# Patient Record
Sex: Female | Born: 1975 | Race: Black or African American | Hispanic: No | Marital: Single | Smoking: Current every day smoker
Health system: Southern US, Community
[De-identification: ages and names within clinical notes are randomized; demographics above are authoritative.]

## PROBLEM LIST (undated history)

## (undated) DIAGNOSIS — R519 Headache, unspecified: Secondary | ICD-10-CM

## (undated) DIAGNOSIS — F419 Anxiety disorder, unspecified: Secondary | ICD-10-CM

## (undated) DIAGNOSIS — R51 Headache: Secondary | ICD-10-CM

## (undated) DIAGNOSIS — B019 Varicella without complication: Secondary | ICD-10-CM

## (undated) DIAGNOSIS — E785 Hyperlipidemia, unspecified: Secondary | ICD-10-CM

## (undated) DIAGNOSIS — F32A Depression, unspecified: Secondary | ICD-10-CM

## (undated) DIAGNOSIS — K219 Gastro-esophageal reflux disease without esophagitis: Secondary | ICD-10-CM

## (undated) HISTORY — DX: Headache: R51

## (undated) HISTORY — DX: Varicella without complication: B01.9

## (undated) HISTORY — DX: Anxiety disorder, unspecified: F41.9

## (undated) HISTORY — DX: Hyperlipidemia, unspecified: E78.5

## (undated) HISTORY — DX: Gastro-esophageal reflux disease without esophagitis: K21.9

## (undated) HISTORY — DX: Headache, unspecified: R51.9

## (undated) HISTORY — DX: Depression, unspecified: F32.A

## (undated) HISTORY — PX: APPENDECTOMY: SHX54

## (undated) HISTORY — PX: TONSILLECTOMY: SUR1361

---

## 2003-12-12 DIAGNOSIS — O039 Complete or unspecified spontaneous abortion without complication: Secondary | ICD-10-CM

## 2003-12-12 HISTORY — DX: Complete or unspecified spontaneous abortion without complication: O03.9

## 2004-01-26 ENCOUNTER — Inpatient Hospital Stay (HOSPITAL_COMMUNITY): Admission: AD | Admit: 2004-01-26 | Discharge: 2004-01-26 | Payer: Self-pay | Admitting: *Deleted

## 2004-02-05 ENCOUNTER — Inpatient Hospital Stay (HOSPITAL_COMMUNITY): Admission: RE | Admit: 2004-02-05 | Discharge: 2004-02-05 | Payer: Self-pay | Admitting: Family Medicine

## 2004-02-12 ENCOUNTER — Encounter: Payer: Self-pay | Admitting: *Deleted

## 2004-02-12 ENCOUNTER — Encounter (INDEPENDENT_AMBULATORY_CARE_PROVIDER_SITE_OTHER): Payer: Self-pay | Admitting: Specialist

## 2004-02-12 ENCOUNTER — Ambulatory Visit (HOSPITAL_COMMUNITY): Admission: RE | Admit: 2004-02-12 | Discharge: 2004-02-12 | Payer: Self-pay | Admitting: Obstetrics and Gynecology

## 2004-03-03 ENCOUNTER — Encounter: Admission: RE | Admit: 2004-03-03 | Discharge: 2004-03-03 | Payer: Self-pay | Admitting: Obstetrics and Gynecology

## 2004-04-28 ENCOUNTER — Emergency Department (HOSPITAL_COMMUNITY): Admission: EM | Admit: 2004-04-28 | Discharge: 2004-04-28 | Payer: Self-pay | Admitting: Emergency Medicine

## 2004-12-28 ENCOUNTER — Other Ambulatory Visit: Admission: RE | Admit: 2004-12-28 | Discharge: 2004-12-28 | Payer: Self-pay | Admitting: Obstetrics and Gynecology

## 2005-04-10 ENCOUNTER — Inpatient Hospital Stay (HOSPITAL_COMMUNITY): Admission: AD | Admit: 2005-04-10 | Discharge: 2005-04-10 | Payer: Self-pay | Admitting: Obstetrics and Gynecology

## 2005-07-26 ENCOUNTER — Inpatient Hospital Stay (HOSPITAL_COMMUNITY): Admission: AD | Admit: 2005-07-26 | Discharge: 2005-07-28 | Payer: Self-pay | Admitting: Obstetrics and Gynecology

## 2006-03-20 ENCOUNTER — Emergency Department (HOSPITAL_COMMUNITY): Admission: EM | Admit: 2006-03-20 | Discharge: 2006-03-20 | Payer: Self-pay | Admitting: Emergency Medicine

## 2006-06-04 ENCOUNTER — Other Ambulatory Visit: Admission: RE | Admit: 2006-06-04 | Discharge: 2006-06-04 | Payer: Self-pay | Admitting: Obstetrics and Gynecology

## 2006-10-27 ENCOUNTER — Emergency Department (HOSPITAL_COMMUNITY): Admission: EM | Admit: 2006-10-27 | Discharge: 2006-10-27 | Payer: Self-pay | Admitting: Emergency Medicine

## 2007-08-25 ENCOUNTER — Emergency Department (HOSPITAL_COMMUNITY): Admission: EM | Admit: 2007-08-25 | Discharge: 2007-08-26 | Payer: Self-pay | Admitting: Emergency Medicine

## 2007-08-30 ENCOUNTER — Emergency Department (HOSPITAL_COMMUNITY): Admission: EM | Admit: 2007-08-30 | Discharge: 2007-08-30 | Payer: Self-pay | Admitting: Emergency Medicine

## 2007-09-07 ENCOUNTER — Inpatient Hospital Stay (HOSPITAL_COMMUNITY): Admission: AD | Admit: 2007-09-07 | Discharge: 2007-09-09 | Payer: Self-pay | Admitting: Obstetrics and Gynecology

## 2011-04-25 NOTE — H&P (Signed)
[name], [name]                ACCOUNT NO.:  [id number]   MEDICAL RECORD NO.:  [id number]          PATIENT TYPE:  INP   LOCATION:  9168                          [facility]ILITY:  [facility]   PHYSICIAN:  Janine Limbo, M.D.DATE OF BIRTH:  [birth year]   DATE OF ADMISSION:  09/07/2007  DATE OF DISCHARGE:                              HISTORY & PHYSICAL   HISTORY OF PRESENT ILLNESS:  This is a 35 year old, African-American  female, gravida 3, para 1-0-1-1, who is admitted at 11 and 1/7th weeks  for induction of labor secondary to post dates.  The patient reports  that she has had occasional mild uterine contractions; however, she  denies rupture of membranes or bleeding.  The patient reports that her  fetus has been moving normally.  The patient denies any signs or  symptoms associated with preeclampsia.  The patient's pregnancy has been  followed by the CNM Service at [facility].  The patient's  pregnancy is remarkable for:  1)  First trimester UTI; 2)  Tobacco use.   ALLERGIES:  No known allergies, no known drug allergies.   OBSTETRIC HISTORY:  1. Pregnancy #1 in 2005:  First trimester spontaneous abortion with      D&C without complications.  2. Pregnancy #2 in 2006:  Spontaneous vaginal delivery, female infant, 8      pounds 0 ounces, at [redacted] weeks gestation, eight hour labor, with no      complications.  3. Pregnancy #3 is current.   PAST MEDICAL HISTORY:  Negative.   PAST SURGICAL HISTORY:  1. In 1989, appendectomy.  2. In 2000, tonsillectomy.  3. In 2005, D&C.   GYNECOLOGIC HISTORY:  The patient denies any history of abnormal Pap  smears.  The patient denies any history of STDs.  The patient reports  regular monthly menses.   FAMILY HISTORY:  Maternal grandfather with heart disease and diabetes,  maternal grandmother with breast cancer.   GENETIC HISTORY:  Negative.   SOCIAL HISTORY:  The patient is single.  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   Father of the baby, Sue Lush, is involved  and supportive.  The patient does not report any particular religious  affiliation.  The patient denies use of alcohol or drugs; however, she  does smoke cigarettes of one pack per day.   HISTORY OF CURRENT PREGNANCY:  The patient entered care at [redacted] weeks  gestation.  At that time, a UTI was noted, which resulted with a urine  culture showing E. coli.  The patient was treated with Macrobid for  seven days.  The patient's first trimester screening was within normal  limits.  The patient underwent ultrasound for anatomy at 18 weeks and 6  days, which revealed a fetus consistent with dates at that time, normal  fluid, and cervix 4.69 cm; however, the fetal heart anatomy was poorly  visualized at that time.  Maternal serum AFP was done and was within  normal limits as well.  Ultrasound for fetal cardiac anatomy was  repeated at 21 weeks and at that time the fetal  cardiac anatomy was  visualized without difficulty as well as normal amniotic fluid index and  cervix 4 cm.  A 28-week hemoglobin returned with a value of 10.1, and  patient did report some fatigue.  Glucola also returned with a normal  value of 92 at 28 weeks.  The patient was placed on iron due to anemia.  The patient otherwise had an uncomplicated prenatal course.  Gonorrhea  and Chlamydia cultures and Group B Strep were obtained at 36 weeks and  all returned as negative.   PRENATAL LABORATORY DATA:  Initial hemoglobin 12.7, hematocrit 38.9,  platelets 235,000, blood type O-positive, antibody screen negative,  sickle cell trait negative, RPR nonreactive, Rubella titer immune,  hepatitis B surface antigen negative, HIV nonreactive, Pap June of 2007  within normal limits, gonorrhea and Chlamydia at new OB visit negative,  cystic fibrosis negative, first trimester screen negative, maternal  serum AFP within normal limits, 28-week hemoglobin 10.1, Glucola at 28  weeks 92, Group B  Strep negative, gonorrhea and Chlamydia cultures third  trimester negative.   OBJECTIVE:  VITAL SIGNS:  The patient is afebrile, vital signs are  stable.  GENERAL:  The patient is alert and oriented in no apparent distress.  SKIN:  Warm and dry, color is satisfactory.  HEENT:  Within normal limits.  NECK:  Thyroid normal and not enlarged.  CHEST:  Clear.  HEART:  Regular rate and rhythm.  ABDOMEN:  Soft, gravid, and nontender.  Fetal heart rate reassuring and  reactive.  There are uterine contractions noted on toco on admission.  The fetus is vertex to Leopold's maneuvers.  STERILE VAGINAL EXAM:  2 to 3 cm, 50% effaced, minus-3 station, vertex  presentation, posterior.  EXTREMITIES:  Negative Homan's and negative edema bilaterally.   ASSESSMENT:  1. Intrauterine pregnancy at 1 and 4/7th weeks.  2. Post dates induction of labor.   PLAN:  The risks, benefits, and alternatives of Pitocin for induction of  labor discussed with the patient and the father of the baby, and they  desire to proceed.  The patient will begin a high dose Pitocin per  protocol and we will observe carefully.  The patient desires epidural  for anesthesia.  A consult was obtained with Dr. Stefano Gaul, who agrees  with the above-noted plan.      Rhona Leavens, CNM      Janine Limbo, M.D.  Electronically Signed    NOS/MEDQ  D:  09/07/2007  T:  09/07/2007  Job:  [id number]

## 2011-04-28 NOTE — Op Note (Signed)
[name], [name]                            ACCOUNT NO.:  [id number]   MEDICAL RECORD NO.:  [id number]                   PATIENT TYPE:  AMB   LOCATION:  SDC                                  [facility]:  [facility]   PHYSICIAN:  Phil D. Okey Dupre, M.D.                  DATE OF BIRTH:  October 15, 1976   DATE OF PROCEDURE:  02/12/2004  DATE OF DISCHARGE:                                 OPERATIVE REPORT   PREOPERATIVE DIAGNOSIS:  Missed abortion.   POSTOPERATIVE DIAGNOSIS:  Missed abortion.   PROCEDURE:  Dilatation and evacuation.   SURGEON:  Javier Glazier. Okey Dupre, M.D.   ANESTHESIA:  MAC plus local.   ESTIMATED BLOOD LOSS:  Minimal.   The procedure went as follows:  Under satisfactory MAC sedation with the  patient in the dorsal lithotomy position, the perineum and vagina were  prepped and draped in the usual sterile manner and bimanual pelvic  examination revealed a uterus about six weeks' gestational size, anterior,  flexed freely movable, normal free adnexa.  A weighted speculum was placed  in the posterior fourchette of the vagina.  The anterior lip of the cervix  was grasped with a single-tooth tenaculum and the uterine cavity sounded to  a depth of 10 cm.  The cervical os dilated to a #8 Hegar dilator and a #8  curved suction curette was used to evacuate the uterine contents without  incident.  Tenaculum and speculum were removed from the vagina and the  patient transferred to the recovery room in satisfactory condition.                                               Phil D. Okey Dupre, M.D.    PDR/MEDQ  D:  02/12/2004  T:  02/12/2004  Job:  [id number]

## 2011-04-28 NOTE — H&P (Signed)
[name], [name]                ACCOUNT NO.:  [id number]   MEDICAL RECORD NO.:  [id number]          PATIENT TYPE:  INP   LOCATION:  9170                          [facility]  [facility]   PHYSICIAN:  Naima A. Dillard, M.D. DATE OF BIRTH:  02/01/1976   DATE OF ADMISSION:  07/26/2005  DATE OF DISCHARGE:                                HISTORY & PHYSICAL   HISTORY OF PRESENT ILLNESS:  [name] is a 35 year old gravida 2, para 0-0-1-  0 at 43 weeks' gestation, EDD July 26, 2005, who presents in early labor.  Contractions began at 5 o'clock this morning and have become stronger and  more regular.  She reports positive fetal movement, n./o vaginal bleeding.  Spontaneous rupture of membranes occurred on exam for moderate meconium  stained amniotic fluid.  The patient denies any headache, visual changes or  epigastric pain.   Her pregnancy has been followed by the CNM service at [facility] and is remarkable  for  1.  Smoker.  2)  Baby with right pyelectasis and hydronephrosis, followed by      [facility].  3) Group B strep negative.   This patient entered prenatal care at [facility] on December 28, 2004, at  approximately 10 weeks' gestation.  EDC determined by dates and confirmed  with ultrasound.  At 18-week ultrasound EIF was noted in the left ventricle  and bilateral renal pyelectasis.  Because of  two soft markers for Down  syndrome, the patient was referred for evaluation and counseling at [facility].  EIF had resolved at next ultrasound and pyelectasis  on  the right continued.  Followup ultrasound remained stable and followup will  occur postpartum for the baby.  Otherwise, her pregnancy has been  unremarkable.  She has been size equal to dates throughout, normotensive  with no proteinuria.   PAST OBSTETRICAL HISTORY:  In 1995, the patient had a first trimester SAB  with a D&C and no complications and the current pregnancy.   MEDICAL HISTORY:  Unremarkable.   SURGICAL HISTORY:   Appendix removed at age 43, tonsils removed at age 41 and  a D&C in 2005.   HABITS:  The patient does admit to smoking cigarettes.  She denies the use  of tobacco, alcohol or illicit drugs.   FAMILY HISTORY:  Maternal grandfather MI.  The patient's aunt and maternal  grandfather with diabetes. Maternal grandmother - breast cancer.  Aunt's and  maternal great-grandmother also with breast cancer.   GENETIC HISTORY:  There is no family history of familial or chromosomal  disorders, children that were born with birth defects or any that died in  infancy.   SOCIAL HISTORY:  [name] below is a single African-American female. The  father of the baby, Chinita Greenland, is involved and supportive.  They do not  describe to a religious faith.   REVIEW OF SYSTEMS:  There are no signs or symptoms suggestive of focal or  systemic disease, and the patient is typical of one with a uterine pregnancy  at term in early labor.   ALLERGIES:  The patient  has no known drug allergies.   PHYSICAL EXAMINATION:  VITAL SIGNS:  The vital signs are stable. The patient  is afebrile.  HEENT is unremarkable.  HEART:  Regular rate and rhythm.  LUNGS:  Clear.  ABDOMEN:  Gravid is gravid in its contour.  Uterine fundus is noted to  extend 40 cm above the level of the pubic symphysis.  Leopold's maneuvers  finds the infant to be in a longitudinal lie, cephalic presentation, and the  estimated fetal weight is 7-1/2 pounds.  The baseline of the fetal heart  rate monitor is 140s with average long-term variability.  Reactivity is  present with no periodic changes.  The patient is contracting every 2-3  minutes.  PELVIC:  Digital exam of her cervix finds it to be 3 cm dilated, 90% effaced  with cephalic presenting part at a -1 station.  Spontaneous rupture of  membranes occurred on exam for moderate meconium stained amniotic fluid.  EXTREMITIES:  No pathologic edema. DTRs are 1+ with no clonus.   ASSESSMENT:   Intrauterine pregnancy at term, early labor.   PLAN:  1.  Admit per Dr. Samule Ohm A. Dillard.  2.  Routine C.N.M. orders.  3.  When the patient is on birthing suite, place [facility] for amnioinfusion.  4.  Pediatrics delivery. '  5.  Anticipate spontaneous vaginal delivery.      Rica Koyanagi, C.N.M.      Naima A. Normand Sloop, M.D.  Electronically Signed    SDM/MEDQ  D:  07/26/2005  T:  07/26/2005  Job:  [id number]

## 2011-09-21 LAB — CBC
HCT: 29 — ABNORMAL LOW
Hemoglobin: 10.6 — ABNORMAL LOW
Hemoglobin: 9.9 — ABNORMAL LOW
RBC: 3.35 — ABNORMAL LOW
RBC: 3.59 — ABNORMAL LOW
WBC: 17.1 — ABNORMAL HIGH

## 2011-09-21 LAB — CCBB MATERNAL DONOR DRAW

## 2017-01-08 ENCOUNTER — Ambulatory Visit (INDEPENDENT_AMBULATORY_CARE_PROVIDER_SITE_OTHER): Payer: 59 | Admitting: Family Medicine

## 2017-01-08 ENCOUNTER — Encounter: Payer: Self-pay | Admitting: Family Medicine

## 2017-01-08 VITALS — BP 132/80 | HR 91 | Resp 12 | Ht 70.5 in | Wt 222.2 lb

## 2017-01-08 DIAGNOSIS — Z23 Encounter for immunization: Secondary | ICD-10-CM

## 2017-01-08 DIAGNOSIS — G44229 Chronic tension-type headache, not intractable: Secondary | ICD-10-CM

## 2017-01-08 DIAGNOSIS — G4700 Insomnia, unspecified: Secondary | ICD-10-CM | POA: Insufficient documentation

## 2017-01-08 MED ORDER — AMITRIPTYLINE HCL 25 MG PO TABS: 25.0000 mg | ORAL_TABLET | Freq: Every day | ORAL | 1 refills | Status: DC

## 2017-01-08 NOTE — Progress Notes (Signed)
HPI:   [name] is a 41 y.o. female, who is here today to establish care with me.  Former PCP: She has not had a PCP, she follows with her gynecologist periodically.   Chronic medical problems: GERD, headaches.  She does not exercise regularly, she has not been consistent with a healthy diet. She lives with her children.  + Smoker.  Concerns today: Headaches and fatigue.  Headache for years , frontal or temporal, 5 times per week, she does not wake up with headache but rather it starts during the day. Excedrin and aleve help. She has not identified exacerbating factors. Pain is sharp, sometimes it can be 9/10. No associated visual changes, photophobia, nausea, vomiting, or focal deficit.  Fatigue:  This problem has also been going on for years.   Fatigue usually gets worse as the day goes, she tells me that she is ready to go to bed around 5:55 PM. She denies formal diagnosis of anxiety and depression. 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 He denies any history of suicidal thoughts.   She also has history of insomnia, she has trouble falling asleep and staying asleep, wakes up "many" times during the night. She sleeps about 5-6 hours. She denies history of sleep apnea.  Drinks 1 glass 8 oz of wine 5 times per week.   Review of Systems  Constitutional: Positive for fatigue. Negative for activity change, appetite change, fever and unexpected weight change.  HENT: Negative for congestion, mouth sores, nosebleeds, sinus pain, sore throat, trouble swallowing and voice change.   Eyes: Negative for photophobia, pain and visual disturbance.  Respiratory: Negative for cough, shortness of breath and wheezing.   Cardiovascular: Negative for chest pain, palpitations and leg swelling.  Gastrointestinal: Negative for  abdominal pain, nausea and vomiting.       Negative for changes in bowel habits.  Endocrine: Negative for cold intolerance and heat intolerance.  Genitourinary: Negative for decreased urine volume and hematuria.  Musculoskeletal: Negative for gait problem and myalgias.  Skin: Negative for rash.  Neurological: Positive for dizziness and headaches. Negative for syncope, weakness and numbness.  Psychiatric/Behavioral: Positive for sleep disturbance. Negative for confusion and suicidal ideas. The patient is nervous/anxious.       No current outpatient prescriptions on file prior to visit.   No current facility-administered medications on file prior to visit.      Past Medical History:  Diagnosis Date  . Chicken pox   . Frequent headaches   . GERD (gastroesophageal reflux disease)    Not on File  Family History  Problem Relation Age of Onset  . Arthritis Mother   . Alcohol abuse Mother   . Hypertension Mother   . Mental illness Mother   . Alcohol abuse Father   . Diabetes Paternal Grandmother     Social History   Social History  . Marital status: Single    Spouse name: N/A  . Number of children: N/A  . Years of education: N/A   Social History Main Topics  . Smoking status: Current Every Day Smoker  . Smokeless tobacco: Never Used  . Alcohol use Yes  . Drug use: No  . Sexual activity: Yes   Other Topics Concern  . None   Social History Narrative  . None    Vitals:   01/08/17 1610  BP: 132/80  Pulse: 91  Resp: 12   Body mass index is 31.44 kg/m.   Physical Exam  Nursing note and vitals reviewed. Constitutional: She is oriented to person, place, and time. She appears well-developed. No distress.  HENT:  Head: Atraumatic.  Nose: Right sinus exhibits no frontal sinus tenderness. Left sinus exhibits no frontal sinus tenderness.  Mouth/Throat: Oropharynx is clear and moist and mucous membranes are normal.  Eyes: Conjunctivae and EOM are normal. Pupils  are equal, round, and reactive to light.  Neck: No tracheal deviation present. No thyroid mass and no thyromegaly present.  Cardiovascular: Normal rate and regular rhythm.   No murmur heard. Pulses:      Dorsalis pedis pulses are 2+ on the right side, and 2+ on the left side.  Respiratory: Effort normal and breath sounds normal. No respiratory distress.  GI: Soft. She exhibits no mass. There is no hepatomegaly. There is no tenderness.  Musculoskeletal: She exhibits no edema or tenderness.  Lymphadenopathy:    She has no cervical adenopathy.  Neurological: She is alert and oriented to person, place, and time. She has normal strength. No cranial nerve deficit. Coordination and gait normal.  Skin: Skin is warm. No erythema.  Psychiatric: She has a normal mood and affect.  Well groomed, good eye contact.      ASSESSMENT AND PLAN:   [name] was seen today for establish care.  Diagnoses and all orders for this visit:   Insomnia, unspecified type  Chronic. Good sleep hygiene discussed and recommended. A few treatment options available but because also history of headache at that she would benefit from Amitriptyline. We discussed some side effects. Follow-up in 4 weeks.  -     amitriptyline (ELAVIL) 25 MG tablet; Take 1 tablet (25 mg total) by mouth at bedtime.  Chronic tension-type headache, not intractable  We discussed possible causes of headache, her symptoms suggesting tension-like headache. Migraine also to be consider. She agrees with trying Amitriptyline 25 mg daily, some side effects discussed. Smoking cessation strongly recommended. Instructed about warning signs. Follow-up in 4 weeks.  -     amitriptyline (ELAVIL) 25 MG tablet; Take 1 tablet (25 mg total) by mouth at bedtime.  Need for prophylactic vaccination and inoculation against influenza -     Flu Vaccine QUAD 36+ mos IM   Planning on doing blood work next OV.    [name], MD  [facility]. [place] office.

## 2017-01-08 NOTE — Progress Notes (Signed)
Pre visit review using our clinic review tool, if applicable. No additional management support is needed unless otherwise documented below in the visit note. 

## 2017-01-08 NOTE — Patient Instructions (Signed)
A few things to remember from today's visit:   Insomnia, unspecified type - Plan: amitriptyline (ELAVIL) 25 MG tablet  Chronic tension-type headache, not intractable - Plan: amitriptyline (ELAVIL) 25 MG tablet  Today Amitriptyline started.     ? What can I do to sleep better?   Improving your sleep habits is a good start.  Medical or psychiatric conditions might be making your insomnia worse.  Medicine might help, but you shouldn't use sleeping pills long term.   Some people need more sleep than others.   Sleep usually occurs in two- to three-hour cycles, so it is important to get at least three uninterrupted hours of sleep.  The following tips can help you develop better sleep habits: Go to bed and wake up at the same time each day Lie down to sleep only when sleepy. If you can't sleep after 20 minutes, get out of bed and go to another room; return to the bedroom when you are tired; repeat as necessary. Use bedroom for sleep and sex only. Don't do things in bed that might keep you awake, like watching television, reading, talking on the phone, or worrying Avoid caffeine, nicotine, or alcohol for at least four to six hours beforebedtime\ls1Avoid strenuous exercise within four hours of bedtime. Avoid daytime napping. Relax before going to bed. Avoid eating large meals or drinking a lot of water or other liquids in the evening. Keep the bedroom a comfortable temperature. Use earplugs if noise is a problem. Expose yourself to daytime light for at least 30 minutes each morning    Please be sure medication list is accurate. If a new problem present, please set up appointment sooner than planned today.

## 2017-02-05 ENCOUNTER — Other Ambulatory Visit (INDEPENDENT_AMBULATORY_CARE_PROVIDER_SITE_OTHER): Payer: 59

## 2017-02-05 DIAGNOSIS — R7989 Other specified abnormal findings of blood chemistry: Secondary | ICD-10-CM

## 2017-02-05 DIAGNOSIS — Z0000 Encounter for general adult medical examination without abnormal findings: Secondary | ICD-10-CM | POA: Diagnosis not present

## 2017-02-05 LAB — LDL CHOLESTEROL, DIRECT: LDL DIRECT: 76 mg/dL

## 2017-02-05 LAB — POC URINALSYSI DIPSTICK (AUTOMATED)
BILIRUBIN UA: NEGATIVE
Blood, UA: NEGATIVE
GLUCOSE UA: NEGATIVE
KETONES UA: NEGATIVE
LEUKOCYTES UA: NEGATIVE
Nitrite, UA: NEGATIVE
PH UA: 7
Protein, UA: NEGATIVE
Spec Grav, UA: 1.01
Urobilinogen, UA: 0.2

## 2017-02-05 LAB — HEPATIC FUNCTION PANEL
ALBUMIN: 4.4 g/dL (ref 3.5–5.2)
ALK PHOS: 62 U/L (ref 39–117)
ALT: 17 U/L (ref 0–35)
AST: 23 U/L (ref 0–37)
Bilirubin, Direct: 0.2 mg/dL (ref 0.0–0.3)
Total Bilirubin: 0.8 mg/dL (ref 0.2–1.2)
Total Protein: 6.9 g/dL (ref 6.0–8.3)

## 2017-02-05 LAB — CBC WITH DIFFERENTIAL/PLATELET
BASOS ABS: 0 10*3/uL (ref 0.0–0.1)
Basophils Relative: 0.4 % (ref 0.0–3.0)
EOS PCT: 1.1 % (ref 0.0–5.0)
Eosinophils Absolute: 0.1 10*3/uL (ref 0.0–0.7)
HCT: 44 % (ref 36.0–46.0)
HEMOGLOBIN: 14.7 g/dL (ref 12.0–15.0)
LYMPHS PCT: 42.1 % (ref 12.0–46.0)
Lymphs Abs: 2.7 10*3/uL (ref 0.7–4.0)
MCHC: 33.4 g/dL (ref 30.0–36.0)
MCV: 87.6 fl (ref 78.0–100.0)
MONOS PCT: 6.9 % (ref 3.0–12.0)
Monocytes Absolute: 0.4 10*3/uL (ref 0.1–1.0)
Neutro Abs: 3.1 10*3/uL (ref 1.4–7.7)
Neutrophils Relative %: 49.5 % (ref 43.0–77.0)
Platelets: 234 10*3/uL (ref 150.0–400.0)
RBC: 5.02 Mil/uL (ref 3.87–5.11)
RDW: 13.6 % (ref 11.5–15.5)
WBC: 6.3 10*3/uL (ref 4.0–10.5)

## 2017-02-05 LAB — BASIC METABOLIC PANEL
BUN: 9 mg/dL (ref 6–23)
CALCIUM: 9.3 mg/dL (ref 8.4–10.5)
CHLORIDE: 103 meq/L (ref 96–112)
CO2: 25 meq/L (ref 19–32)
CREATININE: 0.83 mg/dL (ref 0.40–1.20)
GFR: 98.67 mL/min (ref >60.00)
GLUCOSE: 94 mg/dL (ref 70–99)
Potassium: 4.1 mEq/L (ref 3.5–5.1)
Sodium: 135 mEq/L (ref 135–145)

## 2017-02-05 LAB — LIPID PANEL
CHOL/HDL RATIO: 5
Cholesterol: 179 mg/dL (ref 0–200)
HDL: 38.1 mg/dL — AB (ref >39.00)
NONHDL: 140.52
TRIGLYCERIDES: 384 mg/dL — AB (ref 0.0–149.0)
VLDL: 76.8 mg/dL — ABNORMAL HIGH (ref 0.0–40.0)

## 2017-02-05 LAB — TSH: TSH: 2.52 u[IU]/mL (ref 0.35–4.50)

## 2017-02-13 ENCOUNTER — Other Ambulatory Visit: Payer: Self-pay

## 2017-02-13 MED ORDER — SIMVASTATIN 20 MG PO TABS: 20.0000 mg | ORAL_TABLET | Freq: Every day | ORAL | 1 refills | Status: DC

## 2017-12-19 LAB — OB RESULTS CONSOLE ABO/RH: RH Type: POSITIVE

## 2017-12-19 LAB — OB RESULTS CONSOLE ANTIBODY SCREEN: Antibody Screen: NEGATIVE

## 2017-12-19 LAB — OB RESULTS CONSOLE GC/CHLAMYDIA
Chlamydia: NEGATIVE
Gonorrhea: NEGATIVE

## 2017-12-19 LAB — OB RESULTS CONSOLE RUBELLA ANTIBODY, IGM: Rubella: IMMUNE

## 2017-12-19 LAB — OB RESULTS CONSOLE RPR: RPR: NONREACTIVE

## 2017-12-19 LAB — OB RESULTS CONSOLE HEPATITIS B SURFACE ANTIGEN: Hepatitis B Surface Ag: NEGATIVE

## 2017-12-19 LAB — OB RESULTS CONSOLE HIV ANTIBODY (ROUTINE TESTING): HIV: NONREACTIVE

## 2018-04-23 LAB — OB RESULTS CONSOLE GBS: GBS: NEGATIVE

## 2018-05-12 ENCOUNTER — Encounter (HOSPITAL_COMMUNITY): Payer: Self-pay | Admitting: *Deleted

## 2018-05-12 ENCOUNTER — Inpatient Hospital Stay (HOSPITAL_COMMUNITY)
Admission: AD | Admit: 2018-05-12 | Discharge: 2018-05-15 | DRG: 788 | Disposition: A | Discharge status: Home / Self Care | Payer: 59 | Attending: Obstetrics and Gynecology | Admitting: Obstetrics and Gynecology

## 2018-05-12 DIAGNOSIS — Z98891 History of uterine scar from previous surgery: Secondary | ICD-10-CM

## 2018-05-12 DIAGNOSIS — O99334 Smoking (tobacco) complicating childbirth: Secondary | ICD-10-CM | POA: Diagnosis present

## 2018-05-12 DIAGNOSIS — F17210 Nicotine dependence, cigarettes, uncomplicated: Secondary | ICD-10-CM | POA: Diagnosis present

## 2018-05-12 DIAGNOSIS — O326XX0 Maternal care for compound presentation, not applicable or unspecified: Secondary | ICD-10-CM | POA: Diagnosis present

## 2018-05-12 DIAGNOSIS — Z3A38 38 weeks gestation of pregnancy: Secondary | ICD-10-CM

## 2018-05-12 DIAGNOSIS — Z3483 Encounter for supervision of other normal pregnancy, third trimester: Secondary | ICD-10-CM | POA: Diagnosis present

## 2018-05-12 DIAGNOSIS — O321XX0 Maternal care for breech presentation, not applicable or unspecified: Principal | ICD-10-CM | POA: Diagnosis present

## 2018-05-12 LAB — CBC
HCT: 32.2 % — ABNORMAL LOW (ref 36.0–46.0)
Hemoglobin: 10.8 g/dL — ABNORMAL LOW (ref 12.0–15.0)
MCH: 28.4 pg (ref 26.0–34.0)
MCHC: 33.5 g/dL (ref 30.0–36.0)
MCV: 84.7 fL (ref 78.0–100.0)
PLATELETS: 280 10*3/uL (ref 150–400)
RBC: 3.8 MIL/uL — ABNORMAL LOW (ref 3.87–5.11)
RDW: 14.9 % (ref 11.5–15.5)
WBC: 12.6 10*3/uL — ABNORMAL HIGH (ref 4.0–10.5)

## 2018-05-12 LAB — POCT FERN TEST: POCT Fern Test: POSITIVE

## 2018-05-12 MED ORDER — TERBUTALINE SULFATE 1 MG/ML IJ SOLN: 0.2500 mg | Freq: Once | INTRAMUSCULAR | Status: DC | PRN

## 2018-05-12 MED ORDER — LACTATED RINGERS IV SOLN
INTRAVENOUS | Status: DC
  Administered 2018-05-12 – 2018-05-13 (×2): via INTRAVENOUS

## 2018-05-12 MED ORDER — OXYTOCIN 40 UNITS IN LACTATED RINGERS INFUSION - SIMPLE MED
1.0000 m[IU]/min | INTRAVENOUS | Status: DC
  Administered 2018-05-13: 2 m[IU]/min via INTRAVENOUS
  Filled 2018-05-12: qty 1000

## 2018-05-12 MED ORDER — LIDOCAINE HCL (PF) 1 % IJ SOLN: 30.0000 mL | INTRAMUSCULAR | Status: DC | PRN

## 2018-05-12 MED ORDER — SOD CITRATE-CITRIC ACID 500-334 MG/5ML PO SOLN
30.0000 mL | ORAL | Status: DC | PRN
  Filled 2018-05-12: qty 15

## 2018-05-12 MED ORDER — ONDANSETRON HCL 4 MG/2ML IJ SOLN: 4.0000 mg | Freq: Four times a day (QID) | INTRAMUSCULAR | Status: DC | PRN

## 2018-05-12 MED ORDER — LACTATED RINGERS IV SOLN
500.0000 mL | INTRAVENOUS | Status: DC | PRN
  Administered 2018-05-13 (×2): 500 mL via INTRAVENOUS

## 2018-05-12 MED ORDER — OXYCODONE-ACETAMINOPHEN 5-325 MG PO TABS: 2.0000 | ORAL_TABLET | ORAL | Status: DC | PRN

## 2018-05-12 MED ORDER — OXYCODONE-ACETAMINOPHEN 5-325 MG PO TABS: 1.0000 | ORAL_TABLET | ORAL | Status: DC | PRN

## 2018-05-12 MED ORDER — OXYTOCIN BOLUS FROM INFUSION: 500.0000 mL | Freq: Once | INTRAVENOUS | Status: DC

## 2018-05-12 MED ORDER — OXYTOCIN 40 UNITS IN LACTATED RINGERS INFUSION - SIMPLE MED: 2.5000 [IU]/h | INTRAVENOUS | Status: DC

## 2018-05-12 MED ORDER — ACETAMINOPHEN 325 MG PO TABS: 650.0000 mg | ORAL_TABLET | ORAL | Status: DC | PRN

## 2018-05-12 MED ORDER — FENTANYL CITRATE (PF) 100 MCG/2ML IJ SOLN: 50.0000 ug | INTRAMUSCULAR | Status: DC | PRN

## 2018-05-12 NOTE — H&P (Signed)
[name] is a 42 y.o. female [id number] at 2538 2/7 weeks(EDD 05/24/18 by LMP c/w 6 week US) presenting for SROM at 2100pm with a moderate amount of clear fluid noted.  She is having minimal contractions. Prenatal care complicated by AMA with low risk Panorama screen.  She is also a smoker of 8-10 cigarettes daily.  OB History    Gravida  4   Para  2   Term      Preterm      AB  1   Living  2     SAB      TAB      Ectopic      Multiple      Live Births            2006 NSVD 8# 2008 NSVD 8#2oz  Past Medical History:  Diagnosis Date  . Chicken pox   . Frequent headaches   . GERD (gastroesophageal reflux disease)    Past Surgical History:  Procedure Laterality Date  . APPENDECTOMY    . TONSILLECTOMY     Family History: family history includes Alcohol abuse in her father and mother; Arthritis in her mother; Diabetes in her paternal grandmother; Hypertension in her mother; Mental illness in her mother. Social History:  reports that she has been smoking cigarettes.  She has never used smokeless tobacco. She reports that she drank alcohol. She reports that she does not use drugs.     Maternal Diabetes: No Genetic Screening: Normal Maternal Ultrasounds/Referrals: Normal Fetal Ultrasounds or other Referrals:  None Maternal Substance Abuse:  Yes:  Type: Smoker Significant Maternal Medications:  None Significant Maternal Lab Results:  Lab values include: Group B Strep negative Other Comments:  None  Review of Systems  Gastrointestinal: Negative for abdominal pain.  Neurological: Negative for headaches.   Maternal Medical History:  Reason for admission: Rupture of membranes.   Contractions: Onset was 1-2 hours ago.   Frequency: irregular.   Perceived severity is mild.    Fetal activity: Perceived fetal activity is normal.    Prenatal Complications - Diabetes: none.      Blood pressure 125/74, pulse (!) 104, temperature 98.4 F (36.9 C), temperature source  Oral, resp. rate 16, height 5\' 11"  (1.803 m), weight 97.5 kg (215 lb). Maternal Exam:  Uterine Assessment: Contraction strength is mild.  Contraction frequency is irregular.   Abdomen: Patient reports no abdominal tenderness. Fetal presentation: vertex  Introitus: Normal vulva. Normal vagina.  Ferning test: positive.  Amniotic fluid character: clear.     Physical Exam  Constitutional: She appears well-developed and well-nourished.  Cardiovascular: Normal rate and regular rhythm.  Respiratory: Effort normal.  GI: Soft.  Genitourinary: Vagina normal.  Neurological: She is alert.  Psychiatric: She has a normal mood and affect.    Prenatal labs: ABO, Rh:  O positive Antibody:  negative Rubella:  Immune RPR:   NR HBsAg:   Neg HIV:   NR GBS:   Neg Hgb AA Panorama negative One hour GCT 138 Three hour GTT WNL 88/180/145/90   Assessment/Plan: Pt with confirmed SROM but no significant contractions.  Will admit and augment with pitocin. Epidural prn.  FHR with accels and some decreased variability but no decels.  Will IV hydrate and follow.     [name] 05/12/2018, 11:04 PM

## 2018-05-13 ENCOUNTER — Inpatient Hospital Stay (HOSPITAL_COMMUNITY): Payer: 59 | Admitting: Anesthesiology

## 2018-05-13 ENCOUNTER — Encounter (HOSPITAL_COMMUNITY): Payer: Self-pay | Admitting: General Practice

## 2018-05-13 ENCOUNTER — Encounter (HOSPITAL_COMMUNITY)
Admission: AD | Disposition: A | Discharge status: Home / Self Care | Payer: Self-pay | Source: Home / Self Care | Attending: Obstetrics and Gynecology

## 2018-05-13 ENCOUNTER — Other Ambulatory Visit: Payer: Self-pay

## 2018-05-13 DIAGNOSIS — Z98891 History of uterine scar from previous surgery: Secondary | ICD-10-CM

## 2018-05-13 LAB — TYPE AND SCREEN
ABO/RH(D): O POS
ANTIBODY SCREEN: NEGATIVE

## 2018-05-13 LAB — ABO/RH: ABO/RH(D): O POS

## 2018-05-13 LAB — RPR: RPR Ser Ql: NONREACTIVE

## 2018-05-13 SURGERY — Surgical Case
Anesthesia: Epidural

## 2018-05-13 MED ORDER — PHENYLEPHRINE 40 MCG/ML (10ML) SYRINGE FOR IV PUSH (FOR BLOOD PRESSURE SUPPORT)
80.0000 ug | PREFILLED_SYRINGE | INTRAVENOUS | Status: DC | PRN
  Filled 2018-05-13: qty 10

## 2018-05-13 MED ORDER — DIPHENHYDRAMINE HCL 25 MG PO CAPS: 25.0000 mg | ORAL_CAPSULE | Freq: Four times a day (QID) | ORAL | Status: DC | PRN

## 2018-05-13 MED ORDER — TETANUS-DIPHTH-ACELL PERTUSSIS 5-2.5-18.5 LF-MCG/0.5 IM SUSP: 0.5000 mL | Freq: Once | INTRAMUSCULAR | Status: DC

## 2018-05-13 MED ORDER — OXYTOCIN 10 UNIT/ML IJ SOLN
INTRAMUSCULAR | Status: AC
Start: 2018-05-13 — End: ?
  Filled 2018-05-13: qty 5

## 2018-05-13 MED ORDER — EPHEDRINE 5 MG/ML INJ: 10.0000 mg | INTRAVENOUS | Status: DC | PRN

## 2018-05-13 MED ORDER — HYDROMORPHONE HCL 1 MG/ML IJ SOLN: 0.2500 mg | INTRAMUSCULAR | Status: DC | PRN

## 2018-05-13 MED ORDER — SIMETHICONE 80 MG PO CHEW
80.0000 mg | CHEWABLE_TABLET | Freq: Three times a day (TID) | ORAL | Status: DC
  Administered 2018-05-14: 80 mg via ORAL
  Filled 2018-05-13 (×2): qty 1

## 2018-05-13 MED ORDER — LACTATED RINGERS IV SOLN
INTRAVENOUS | Status: DC | PRN
  Administered 2018-05-13 (×2): via INTRAVENOUS

## 2018-05-13 MED ORDER — DIPHENHYDRAMINE HCL 50 MG/ML IJ SOLN: 12.5000 mg | INTRAMUSCULAR | Status: DC | PRN

## 2018-05-13 MED ORDER — PHENYLEPHRINE 40 MCG/ML (10ML) SYRINGE FOR IV PUSH (FOR BLOOD PRESSURE SUPPORT)
PREFILLED_SYRINGE | INTRAVENOUS | Status: AC
  Filled 2018-05-13: qty 10

## 2018-05-13 MED ORDER — SCOPOLAMINE 1 MG/3DAYS TD PT72
MEDICATED_PATCH | TRANSDERMAL | Status: AC
  Filled 2018-05-13: qty 1

## 2018-05-13 MED ORDER — ONDANSETRON HCL 4 MG/2ML IJ SOLN: 4.0000 mg | Freq: Three times a day (TID) | INTRAMUSCULAR | Status: DC | PRN

## 2018-05-13 MED ORDER — IBUPROFEN 600 MG PO TABS
600.0000 mg | ORAL_TABLET | Freq: Four times a day (QID) | ORAL | Status: DC
  Administered 2018-05-13 – 2018-05-15 (×7): 600 mg via ORAL
  Filled 2018-05-13 (×8): qty 1

## 2018-05-13 MED ORDER — PHENYLEPHRINE 40 MCG/ML (10ML) SYRINGE FOR IV PUSH (FOR BLOOD PRESSURE SUPPORT): 80.0000 ug | PREFILLED_SYRINGE | INTRAVENOUS | Status: DC | PRN

## 2018-05-13 MED ORDER — PRENATAL MULTIVITAMIN CH
1.0000 | ORAL_TABLET | Freq: Every day | ORAL | Status: DC
  Administered 2018-05-14: 1 via ORAL
  Filled 2018-05-13: qty 1

## 2018-05-13 MED ORDER — ONDANSETRON HCL 4 MG/2ML IJ SOLN
INTRAMUSCULAR | Status: AC
  Filled 2018-05-13: qty 2

## 2018-05-13 MED ORDER — SCOPOLAMINE 1 MG/3DAYS TD PT72: 1.0000 | MEDICATED_PATCH | Freq: Once | TRANSDERMAL | Status: DC

## 2018-05-13 MED ORDER — ACETAMINOPHEN 325 MG PO TABS
650.0000 mg | ORAL_TABLET | ORAL | Status: DC | PRN
Start: 2018-05-13 — End: 2018-05-15
  Administered 2018-05-13: 650 mg via ORAL
  Filled 2018-05-13 (×2): qty 2

## 2018-05-13 MED ORDER — KETOROLAC TROMETHAMINE 30 MG/ML IJ SOLN
INTRAMUSCULAR | Status: AC
  Filled 2018-05-13: qty 1

## 2018-05-13 MED ORDER — ZOLPIDEM TARTRATE 5 MG PO TABS: 5.0000 mg | ORAL_TABLET | Freq: Every evening | ORAL | Status: DC | PRN

## 2018-05-13 MED ORDER — LACTATED RINGERS IV SOLN
500.0000 mL | Freq: Once | INTRAVENOUS | Status: AC
  Administered 2018-05-13: 500 mL via INTRAVENOUS

## 2018-05-13 MED ORDER — OXYCODONE HCL 5 MG PO TABS: 5.0000 mg | ORAL_TABLET | ORAL | Status: DC | PRN

## 2018-05-13 MED ORDER — SIMETHICONE 80 MG PO CHEW
80.0000 mg | CHEWABLE_TABLET | ORAL | Status: DC
  Administered 2018-05-13 – 2018-05-14 (×2): 80 mg via ORAL
  Filled 2018-05-13 (×2): qty 1

## 2018-05-13 MED ORDER — ONDANSETRON HCL 4 MG/2ML IJ SOLN
INTRAMUSCULAR | Status: DC | PRN
  Administered 2018-05-13: 4 mg via INTRAVENOUS

## 2018-05-13 MED ORDER — CEFAZOLIN SODIUM-DEXTROSE 2-3 GM-%(50ML) IV SOLR
INTRAVENOUS | Status: DC | PRN
  Administered 2018-05-13: 2 g via INTRAVENOUS

## 2018-05-13 MED ORDER — LACTATED RINGERS IV SOLN
INTRAVENOUS | Status: DC | PRN
  Administered 2018-05-13 (×2): via INTRAVENOUS

## 2018-05-13 MED ORDER — SENNOSIDES-DOCUSATE SODIUM 8.6-50 MG PO TABS
2.0000 | ORAL_TABLET | ORAL | Status: DC
  Administered 2018-05-13 – 2018-05-14 (×2): 2 via ORAL
  Filled 2018-05-13 (×2): qty 2

## 2018-05-13 MED ORDER — KETOROLAC TROMETHAMINE 30 MG/ML IJ SOLN
30.0000 mg | Freq: Once | INTRAMUSCULAR | Status: AC | PRN
  Administered 2018-05-13: 30 mg via INTRAVENOUS

## 2018-05-13 MED ORDER — MEPERIDINE HCL 25 MG/ML IJ SOLN: 6.2500 mg | INTRAMUSCULAR | Status: DC | PRN

## 2018-05-13 MED ORDER — SIMETHICONE 80 MG PO CHEW: 80.0000 mg | CHEWABLE_TABLET | ORAL | Status: DC | PRN

## 2018-05-13 MED ORDER — OXYCODONE HCL 5 MG PO TABS: 10.0000 mg | ORAL_TABLET | ORAL | Status: DC | PRN

## 2018-05-13 MED ORDER — EPHEDRINE SULFATE 50 MG/ML IJ SOLN
INTRAMUSCULAR | Status: DC | PRN
  Administered 2018-05-13 (×3): 5 mg via INTRAVENOUS

## 2018-05-13 MED ORDER — MORPHINE SULFATE (PF) 0.5 MG/ML IJ SOLN
INTRAMUSCULAR | Status: DC | PRN
  Administered 2018-05-13: 4 mg via EPIDURAL

## 2018-05-13 MED ORDER — SODIUM BICARBONATE 8.4 % IV SOLN
INTRAVENOUS | Status: DC | PRN
  Administered 2018-05-13 (×3): 5 mL via EPIDURAL

## 2018-05-13 MED ORDER — DIPHENHYDRAMINE HCL 25 MG PO CAPS
25.0000 mg | ORAL_CAPSULE | ORAL | Status: DC | PRN
  Filled 2018-05-13: qty 1

## 2018-05-13 MED ORDER — LACTATED RINGERS IV SOLN: INTRAVENOUS | Status: DC

## 2018-05-13 MED ORDER — MORPHINE SULFATE (PF) 0.5 MG/ML IJ SOLN
INTRAMUSCULAR | Status: AC
  Filled 2018-05-13: qty 10

## 2018-05-13 MED ORDER — PROMETHAZINE HCL 25 MG/ML IJ SOLN: 6.2500 mg | INTRAMUSCULAR | Status: DC | PRN

## 2018-05-13 MED ORDER — NALBUPHINE HCL 10 MG/ML IJ SOLN: 5.0000 mg | Freq: Once | INTRAMUSCULAR | Status: DC | PRN

## 2018-05-13 MED ORDER — DEXAMETHASONE SODIUM PHOSPHATE 4 MG/ML IJ SOLN
INTRAMUSCULAR | Status: DC | PRN
  Administered 2018-05-13: 4 mg via INTRAVENOUS

## 2018-05-13 MED ORDER — NALOXONE HCL 4 MG/10ML IJ SOLN
1.0000 ug/kg/h | INTRAVENOUS | Status: DC | PRN
  Filled 2018-05-13: qty 5

## 2018-05-13 MED ORDER — SODIUM CHLORIDE 0.9% FLUSH: 3.0000 mL | INTRAVENOUS | Status: DC | PRN

## 2018-05-13 MED ORDER — MENTHOL 3 MG MT LOZG: 1.0000 | LOZENGE | OROMUCOSAL | Status: DC | PRN

## 2018-05-13 MED ORDER — DIBUCAINE 1 % RE OINT: 1.0000 "application " | TOPICAL_OINTMENT | RECTAL | Status: DC | PRN

## 2018-05-13 MED ORDER — FENTANYL 2.5 MCG/ML BUPIVACAINE 1/10 % EPIDURAL INFUSION (WH - ANES)
14.0000 mL/h | INTRAMUSCULAR | Status: DC | PRN
  Administered 2018-05-13 (×2): 14 mL/h via EPIDURAL
  Filled 2018-05-13: qty 100

## 2018-05-13 MED ORDER — OXYTOCIN 10 UNIT/ML IJ SOLN
INTRAVENOUS | Status: DC | PRN
  Administered 2018-05-13: 40 [IU] via INTRAVENOUS

## 2018-05-13 MED ORDER — PHENYLEPHRINE HCL 10 MG/ML IJ SOLN
INTRAMUSCULAR | Status: DC | PRN
  Administered 2018-05-13: 80 ug via INTRAVENOUS
  Administered 2018-05-13: 40 ug via INTRAVENOUS
  Administered 2018-05-13 (×5): 80 ug via INTRAVENOUS
  Administered 2018-05-13: 40 ug via INTRAVENOUS

## 2018-05-13 MED ORDER — NALBUPHINE HCL 10 MG/ML IJ SOLN: 5.0000 mg | INTRAMUSCULAR | Status: DC | PRN

## 2018-05-13 MED ORDER — DEXAMETHASONE SODIUM PHOSPHATE 4 MG/ML IJ SOLN
INTRAMUSCULAR | Status: AC
  Filled 2018-05-13: qty 1

## 2018-05-13 MED ORDER — CEFAZOLIN SODIUM-DEXTROSE 2-4 GM/100ML-% IV SOLN
INTRAVENOUS | Status: AC
  Filled 2018-05-13: qty 100

## 2018-05-13 MED ORDER — LIDOCAINE-EPINEPHRINE (PF) 2 %-1:200000 IJ SOLN
INTRAMUSCULAR | Status: AC
  Filled 2018-05-13: qty 20

## 2018-05-13 MED ORDER — LIDOCAINE HCL (PF) 1 % IJ SOLN
INTRAMUSCULAR | Status: DC | PRN
  Administered 2018-05-13 (×2): 4 mL via EPIDURAL

## 2018-05-13 MED ORDER — WITCH HAZEL-GLYCERIN EX PADS: 1.0000 "application " | MEDICATED_PAD | CUTANEOUS | Status: DC | PRN

## 2018-05-13 MED ORDER — COCONUT OIL OIL: 1.0000 "application " | TOPICAL_OIL | Status: DC | PRN

## 2018-05-13 MED ORDER — SCOPOLAMINE 1 MG/3DAYS TD PT72
MEDICATED_PATCH | TRANSDERMAL | Status: DC | PRN
  Administered 2018-05-13: 1 via TRANSDERMAL

## 2018-05-13 MED ORDER — OXYTOCIN 40 UNITS IN LACTATED RINGERS INFUSION - SIMPLE MED: 2.5000 [IU]/h | INTRAVENOUS | Status: AC

## 2018-05-13 MED ORDER — EPHEDRINE 5 MG/ML INJ
INTRAVENOUS | Status: AC
Start: 2018-05-13 — End: ?
  Filled 2018-05-13: qty 10

## 2018-05-13 MED ORDER — NALOXONE HCL 0.4 MG/ML IJ SOLN: 0.4000 mg | INTRAMUSCULAR | Status: DC | PRN

## 2018-05-13 MED ORDER — NALBUPHINE HCL 10 MG/ML IJ SOLN
5.0000 mg | INTRAMUSCULAR | Status: DC | PRN
  Administered 2018-05-13: 5 mg via SUBCUTANEOUS
  Filled 2018-05-13: qty 1

## 2018-05-13 MED ORDER — FENTANYL 2.5 MCG/ML BUPIVACAINE 1/10 % EPIDURAL INFUSION (WH - ANES): 14.0000 mL/h | INTRAMUSCULAR | Status: DC | PRN

## 2018-05-13 SURGICAL SUPPLY — 39 items
APL SKNCLS STERI-STRIP NONHPOA (GAUZE/BANDAGES/DRESSINGS) ×1
BENZOIN TINCTURE PRP APPL 2/3 (GAUZE/BANDAGES/DRESSINGS) ×2 IMPLANT
CHLORAPREP W/TINT 26ML (MISCELLANEOUS) ×3 IMPLANT
CLAMP CORD UMBIL (MISCELLANEOUS) IMPLANT
CLOSURE STERI STRIP 1/2 X4 (GAUZE/BANDAGES/DRESSINGS) ×2 IMPLANT
CLOSURE WOUND 1/2 X4 (GAUZE/BANDAGES/DRESSINGS)
CLOTH BEACON ORANGE TIMEOUT ST (SAFETY) ×3 IMPLANT
DRSG OPSITE POSTOP 4X10 (GAUZE/BANDAGES/DRESSINGS) ×3 IMPLANT
ELECT REM PT RETURN 9FT ADLT (ELECTROSURGICAL) ×3
ELECTRODE REM PT RTRN 9FT ADLT (ELECTROSURGICAL) ×1 IMPLANT
EXTRACTOR VACUUM KIWI (MISCELLANEOUS) IMPLANT
GLOVE BIO SURGEON STRL SZ 6.5 (GLOVE) ×2 IMPLANT
GLOVE BIO SURGEONS STRL SZ 6.5 (GLOVE) ×1
GLOVE BIOGEL PI IND STRL 7.0 (GLOVE) ×1 IMPLANT
GLOVE BIOGEL PI INDICATOR 7.0 (GLOVE) ×2
GOWN STRL REUS W/TWL LRG LVL3 (GOWN DISPOSABLE) ×6 IMPLANT
KIT ABG SYR 3ML LUER SLIP (SYRINGE) ×2 IMPLANT
NDL HYPO 25X5/8 SAFETYGLIDE (NEEDLE) IMPLANT
NEEDLE HYPO 25X5/8 SAFETYGLIDE (NEEDLE) ×3 IMPLANT
NS IRRIG 1000ML POUR BTL (IV SOLUTION) ×3 IMPLANT
PACK C SECTION WH (CUSTOM PROCEDURE TRAY) ×3 IMPLANT
PAD OB MATERNITY 4.3X12.25 (PERSONAL CARE ITEMS) ×3 IMPLANT
PENCIL SMOKE EVAC W/HOLSTER (ELECTROSURGICAL) ×3 IMPLANT
RTRCTR C-SECT PINK 25CM LRG (MISCELLANEOUS) ×3 IMPLANT
STRIP CLOSURE SKIN 1/2X4 (GAUZE/BANDAGES/DRESSINGS) IMPLANT
SUT CHROMIC 1 CTX 36 (SUTURE) ×6 IMPLANT
SUT PLAIN 0 NONE (SUTURE) IMPLANT
SUT PLAIN 2 0 XLH (SUTURE) ×3 IMPLANT
SUT VIC AB 0 CT1 27 (SUTURE) ×6
SUT VIC AB 0 CT1 27XBRD ANBCTR (SUTURE) ×2 IMPLANT
SUT VIC AB 2-0 CT1 27 (SUTURE) ×3
SUT VIC AB 2-0 CT1 TAPERPNT 27 (SUTURE) ×1 IMPLANT
SUT VIC AB 3-0 CT1 27 (SUTURE)
SUT VIC AB 3-0 CT1 TAPERPNT 27 (SUTURE) IMPLANT
SUT VIC AB 3-0 SH 27 (SUTURE) ×3
SUT VIC AB 3-0 SH 27X BRD (SUTURE) IMPLANT
SUT VIC AB 4-0 KS 27 (SUTURE) ×3 IMPLANT
TOWEL OR 17X24 6PK STRL BLUE (TOWEL DISPOSABLE) ×3 IMPLANT
TRAY FOLEY W/BAG SLVR 14FR LF (SET/KITS/TRAYS/PACK) ×3 IMPLANT

## 2018-05-13 NOTE — Progress Notes (Signed)
   05/13/18 0415  Provider Notification  Provider Name/Title Dr. Senaida Oresichardson  Method of Notification Phone  Notification Reason Fetal heart rate change;Membrane status;Vaginal exam;Uterine activity  Dr. Senaida Oresichardson called for pt's update. Informed pt is 6/70/-2 comfortable with epidural, on 6mu of pitocin. Cat II FHT with min/mod variability and variable decels. MD ordered to recheck pt at 0500 and call back with result.

## 2018-05-13 NOTE — Anesthesia Preprocedure Evaluation (Addendum)
Anesthesia Evaluation  Patient identified by MRN, date of birth, ID band Patient awake    Reviewed: Allergy & Precautions, Patient's Chart, lab work & pertinent test results  Airway Mallampati: II  TM Distance: >3 FB Neck ROM: Full    Dental no notable dental hx.    Pulmonary Current Smoker,    Pulmonary exam normal breath sounds clear to auscultation       Cardiovascular + Peripheral Vascular Disease  Normal cardiovascular exam Rhythm:Regular Rate:Normal     Neuro/Psych  Headaches, negative psych ROS   GI/Hepatic Neg liver ROS, GERD  ,  Endo/Other  negative endocrine ROS  Renal/GU negative Renal ROS     Musculoskeletal negative musculoskeletal ROS (+)   Abdominal   Peds  Hematology negative hematology ROS (+)   Anesthesia Other Findings   Reproductive/Obstetrics (+) Pregnancy                             Anesthesia Physical Anesthesia Plan  ASA: II and emergent  Anesthesia Plan: Epidural   Post-op Pain Management:    Induction:   PONV Risk Score and Plan: 3 and Ondansetron, Dexamethasone, Scopolamine patch - Pre-op and Treatment may vary due to age or medical condition  Airway Management Planned:   Additional Equipment:   Intra-op Plan:   Post-operative Plan:   Informed Consent: I have reviewed the patients History and Physical, chart, labs and discussed the procedure including the risks, benefits and alternatives for the proposed anesthesia with the patient or authorized representative who has indicated his/her understanding and acceptance.     Plan Discussed with: CRNA  Anesthesia Plan Comments:        Anesthesia Quick Evaluation

## 2018-05-13 NOTE — Transfer of Care (Signed)
Immediate Anesthesia Transfer of Care Note  Patient: Renae FickleVanessa Fulghum  Procedure(s) Performed: CESAREAN SECTION (N/A )  Patient Location: PACU  Anesthesia Type:Epidural  Level of Consciousness: awake, alert , oriented and patient cooperative  Airway & Oxygen Therapy: Patient Spontanous Breathing  Post-op Assessment: Report given to RN and Post -op Vital signs reviewed and stable  Post vital signs: Reviewed and stable  Last Vitals:  Vitals Value Taken Time  BP 106/54 05/13/2018  6:36 AM  Temp    Pulse 93 05/13/2018  6:37 AM  Resp 16 05/13/2018  6:37 AM  SpO2 95 % 05/13/2018  6:37 AM  Vitals shown include unvalidated device data.  Last Pain:  Vitals:   05/13/18 0500  TempSrc: Oral  PainSc:          Complications: No apparent anesthesia complications

## 2018-05-13 NOTE — Anesthesia Procedure Notes (Signed)
Epidural Patient location during procedure: OB Start time: 05/13/2018 3:01 AM End time: 05/13/2018 3:13 AM  Staffing Anesthesiologist: Lewie LoronGermeroth, Jessika Rothery, MD Performed: anesthesiologist   Preanesthetic Checklist Completed: patient identified, pre-op evaluation, timeout performed, IV checked, risks and benefits discussed and monitors and equipment checked  Epidural Patient position: sitting Prep: site prepped and draped and DuraPrep Patient monitoring: heart rate, blood pressure and continuous pulse ox Approach: midline Location: L3-L4 Injection technique: LOR air and LOR saline  Needle:  Needle type: Tuohy  Needle gauge: 17 G Needle length: 9 cm Needle insertion depth: 5 cm Catheter type: closed end flexible Catheter size: 19 Gauge Catheter at skin depth: 10 cm Test dose: negative  Assessment Sensory level: T8 Events: blood not aspirated, injection not painful, no injection resistance, negative IV test and no paresthesia  Additional Notes Reason for block:procedure for pain

## 2018-05-13 NOTE — Progress Notes (Signed)
Patient ID: [name], female   DOB: 1976/08/13, 42 y.o.   MRN: [id number] Late entry note  I came into hospital when notified patient 6cm and occasional variable decels.  While driving in, the RN called me and stated the presenting part was hard to determine but felt vertex with the compound presentation of an elbow, and the patient was 10 cm.  I arrived within 10 minutes and examined pt to find her to be 10cm compound  breech with one foot presenting.  I d/w pt and she agreed to porceed to the OR immediately for a c-section.  FHR was 120 with variable decels to 90-category 2. All staff was notified and we proceeded to OR expeditiously.

## 2018-05-13 NOTE — Op Note (Signed)
Operative Note    Preoperative Diagnosis Term pregnancy at 38+ weeks Breech presentation with compound presentation of foot Category 2 tracing  Postoperative Diagnosis Same with true knot in cord as well as double nuchal  Procedure Primary Low transverse c-section with double layer closure of uterus  Surgeon Huel CoteKathy Angelino Rumery, MD  Anesthesia Epidural  Fluids: EBL 800mL UOP 150mL IVF 2200mL LR  Findings Viable female infant in the breech presentation.  True knot and double nuchal cord noted.  Apgars 6,7,7 Normal anatomy except bladder varicosities noted.  Specimen Placenta to pathology.  Cord pH attempted but sample not sufficient  Procedure Note Patient was taken to the operating room where epidural anesthesia was boosted and found to be adequate by Allis clamp test. She was prepped and draped in the normal sterile fashion in the dorsal supine position with a leftward tilt. An appropriate time out was performed. A Pfannenstiel skin incision was then made with the scalpel and carried through to the underlying layer of fascia by sharp dissection and Bovie cautery. The fascia was nicked in the midline and the incision was extended laterally with Mayo scissors. The inferior aspect of the incision was grasped Coker clamps and dissected off the underlying rectus muscles. In a similar fashion the superior aspect was dissected off the rectus muscles. Rectus muscles were separated in the midline and the peritoneal cavity entered bluntly. The peritoneal incision was then extended both superiorly and inferiorly with careful attention to avoid both bowel and bladder. The Alexis self-retaining wound retractor was then placed within the incision and the lower uterine segment exposed. The bladder flap was developed with Metzenbaum scissors and pushed away from the lower uterine segment.  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  The lower uterine segment was then incised in a transverse  fashion and the cavity itself entered bluntly. The incision was extended bluntly. The infant's feet wer identified and lifted through the incision.  With gentle traction the infant's bottom was dislodged from the pelvis and lifted up through the incision with the body following.  The arms were reduced across the chest and the head delivered in a flexed position. The nose and mouth were bulb suctioned and the cord clamped and cut as well. The infant was handed off to the waiting pediatricians. The placenta was then spontaneously expressed from the uterus and the uterus cleared of all clots and debris with moist lap sponge. The uterine incision was then repaired in 2 layers the first layer was a running locked layer 1-0 chromic and the second an imbricating layer of the same suture. The tubes and ovaries were inspected and the gutters cleared of all clots and debris. The uterine incision was inspected and found to be hemostatic. All instruments and sponges as well as the Alexis retractor were then removed from the abdomen. The rectus muscles and peritoneum were then reapproximated with several interrupted mattress sutures of 2-0 Vicryl. The fascia was then closed with 0 Vicryl in a running fashion. Subcutaneous tissue was reapproximated with 3-0 plain in a running fashion. The skin was closed with a subcuticular stitch of 4-0 Vicryl on a Keith needle and then reinforced with benzoin and Steri-Strips. At the conclusion of the procedure all instruments and sponge counts were correct.   The baby was observed in the OR for 30 minutes due to being a bit less vigorous and requiring blowby O2 initially, however, was able to ultimately stay with mother and will continue to be observed closely for  any signs of respiratory fatigue.  Patient was taken to the recovery room in good condition with her baby accompanying her skin to skin.

## 2018-05-13 NOTE — Anesthesia Postprocedure Evaluation (Signed)
Anesthesia Post Note  Patient: [name]  Procedure(s) Performed: CESAREAN SECTION (N/A )     Patient location during evaluation: Mother Baby Anesthesia Type: Epidural Level of consciousness: awake and alert Pain management: pain level controlled Vital Signs Assessment: post-procedure vital signs reviewed and stable Respiratory status: spontaneous breathing, nonlabored ventilation and respiratory function stable Cardiovascular status: stable Postop Assessment: no headache, no backache and epidural receding Anesthetic complications: no    Last Vitals:  Vitals:   05/13/18 0730 05/13/18 0754  BP: 112/76 110/80  Pulse: 83 75  Resp: 17 18  Temp:  37.2 C  SpO2: 99% 100%    Last Pain:  Vitals:   05/13/18 0754  TempSrc: Axillary  PainSc: 0-No pain   Pain Goal:                 EchoStarMERRITT,[name]

## 2018-05-13 NOTE — Anesthesia Postprocedure Evaluation (Signed)
Anesthesia Post Note  Patient: [name]  Procedure(s) Performed: CESAREAN SECTION (N/A )     Patient location during evaluation: PACU Anesthesia Type: Spinal Level of consciousness: oriented and awake and alert Pain management: pain level controlled Vital Signs Assessment: post-procedure vital signs reviewed and stable Respiratory status: spontaneous breathing, respiratory function stable and patient connected to nasal cannula oxygen Cardiovascular status: blood pressure returned to baseline and stable Postop Assessment: no headache, no backache and no apparent nausea or vomiting Anesthetic complications: no                  Melton Walls DANIEL

## 2018-05-13 NOTE — Addendum Note (Signed)
Addendum  created 05/13/18 0834 by Orlie PollenMerritt, Cordel Drewes R, CRNA   Sign clinical note

## 2018-05-14 ENCOUNTER — Encounter (HOSPITAL_COMMUNITY): Payer: Self-pay | Admitting: Obstetrics and Gynecology

## 2018-05-14 LAB — CBC
HEMATOCRIT: 27.5 % — AB (ref 36.0–46.0)
Hemoglobin: 9.2 g/dL — ABNORMAL LOW (ref 12.0–15.0)
MCH: 28.2 pg (ref 26.0–34.0)
MCHC: 33.5 g/dL (ref 30.0–36.0)
MCV: 84.4 fL (ref 78.0–100.0)
Platelets: 234 10*3/uL (ref 150–400)
RBC: 3.26 MIL/uL — ABNORMAL LOW (ref 3.87–5.11)
RDW: 15 % (ref 11.5–15.5)
WBC: 17.5 10*3/uL — AB (ref 4.0–10.5)

## 2018-05-14 NOTE — Progress Notes (Signed)
Subjective: Postpartum Day 1: Cesarean Delivery Patient reports tolerating PO, + flatus and no problems voiding.  Bonding well with baby- bottle feeding. Pain well controlled with meds  Objective: Vital signs in last 24 hours: Temp:  [97.7 F (36.5 C)-98.9 F (37.2 C)] 98 F (36.7 C) (06/04 0508) Pulse Rate:  [62-83] 70 (06/04 0508) Resp:  [18-20] 18 (06/04 0508) BP: (91-106)/(57-73) 103/72 (06/04 0508) SpO2:  [98 %-100 %] 100 % (06/04 0508)  Physical Exam:  General: alert, cooperative and no distress Lochia: appropriate Uterine Fundus: firm Incision: no significant drainage DVT Evaluation: No evidence of DVT seen on physical exam.  Recent Labs    05/12/18 2325 05/14/18 0515  HGB 10.8* 9.2*  HCT 32.2* 27.5*    Assessment/Plan: Status post Cesarean section. Doing well postoperatively.  Continue current care.  [name] 05/14/2018, 9:40 AM

## 2018-05-14 NOTE — Addendum Note (Signed)
Addendum  created 05/14/18 1338 by Lewie LoronGermeroth, Sherrol Vicars, MD   Intraprocedure Event edited, Intraprocedure Staff edited

## 2018-05-15 MED ORDER — IBUPROFEN 600 MG PO TABS: 600.0000 mg | ORAL_TABLET | Freq: Four times a day (QID) | ORAL | 0 refills | Status: DC

## 2018-05-15 MED ORDER — ACETAMINOPHEN 325 MG PO TABS: 650.0000 mg | ORAL_TABLET | ORAL | 1 refills | Status: DC | PRN

## 2018-05-15 NOTE — Discharge Summary (Signed)
OB Discharge Summary     Patient Name: Morgan FickleVanessa Briggs DOB: July 10, 1976 MRN: [id number]  Date of admission: 05/12/2018 Delivering MD: Huel CoteICHARDSON, Ameliana Brashear   Date of discharge: 05/15/2018  Admitting diagnosis: 38 WKS, WATER BROKE Intrauterine pregnancy: 319w3d     Secondary diagnosis:  Active Problems:   Indication for care in labor and delivery, antepartum   S/P primary low transverse C-section Breech presentation in labor  Additional problems: none     Discharge diagnosis: Term Pregnancy Delivered                                        Breech presentation                                       S/p LTCS                                                                                                 Post partum procedures:none  Augmentation: Pitocin until breech presentation was realized  Complications: None  Hospital course:  Onset of Labor With Unplanned C/S  42 y.o. yo G4P1013 at 859w3d was admitted in Latent Labor on 05/12/2018. Patient had a labor course significant for breech presentation which was not initially realized so labor was augmented. Membrane Rupture Time/Date: 9:00 PM ,05/12/2018   The patient went for cesarean section due to Malpresentation, and delivered a Viable infant,05/13/2018  Details of operation can be found in separate operative note. Patient had an uncomplicated postpartum course.  She is ambulating,tolerating a regular diet, passing flatus, and urinating well.  Patient is discharged home in stable condition 05/15/18.  Physical exam  Vitals:   05/13/18 2320 05/14/18 0508 05/14/18 1800 05/15/18 0529  BP: 105/73 103/72 128/80 126/70  Pulse: 80 70 85 68  Resp:  18 20 18   Temp: 98 F (36.7 C) 98 F (36.7 C) 98.2 F (36.8 C) 97.6 F (36.4 C)  TempSrc: Oral  Oral Oral  SpO2:  100% 100%   Weight:      Height:       General: alert and cooperative Lochia: appropriate Uterine Fundus: firm Incision: Dressing is clean, dry, and intact  Labs: Lab Results   Component Value Date   WBC 17.5 (H) 05/14/2018   HGB 9.2 (L) 05/14/2018   HCT 27.5 (L) 05/14/2018   MCV 84.4 05/14/2018   PLT 234 05/14/2018   CMP Latest Ref Rng & Units 02/05/2017  Glucose 70 - 99 mg/dL 94  BUN 6 - 23 mg/dL 9  Creatinine 4.090.40 - 8.111.20 mg/dL 9.140.83  Sodium 782135 - 956145 mEq/L 135  Potassium 3.5 - 5.1 mEq/L 4.1  Chloride 96 - 112 mEq/L 103  CO2 19 - 32 mEq/L 25  Calcium 8.4 - 10.5 mg/dL 9.3  Total Protein 6.0 - 8.3 g/dL 6.9  Total Bilirubin 0.2 - 1.2 mg/dL 0.8  Alkaline Phos 39 - 117 U/L 62  AST 0 -  37 U/L 23  ALT 0 - 35 U/L 17    Discharge instruction: per After Visit Summary and "Baby and Me Booklet".  After visit meds:  Allergies as of 05/15/2018   No Known Allergies     Medication List    TAKE these medications   acetaminophen 325 MG tablet Commonly known as:  TYLENOL Take 2 tablets (650 mg total) by mouth every 4 (four) hours as needed (for pain scale < 4).   ferrous sulfate 325 (65 FE) MG tablet Take 325 mg by mouth every other day.   ibuprofen 600 MG tablet Commonly known as:  ADVIL,MOTRIN Take 1 tablet (600 mg total) by mouth every 6 (six) hours.   loratadine 10 MG tablet Commonly known as:  CLARITIN Take 10 mg by mouth daily as needed for allergies.   prenatal multivitamin Tabs tablet Take 1 tablet by mouth daily at 12 noon.       Diet: routine diet  Activity: Advance as tolerated. Pelvic rest for 6 weeks.   Outpatient follow up:2 weeks Follow up Appt:No future appointments. Follow up Visit:No follow-ups on file.  Postpartum contraception: IUD Mirena  Newborn Data: Live born female  Birth Weight: 7 lb 4.8 oz (3310 g) APGAR: 6, 7  Newborn Delivery   Birth date/time:  05/13/2018 05:34:00 Delivery type:  C-Section, Low Transverse C-section categorization:  Primary     Baby Feeding: Bottle Disposition:home with mother   05/15/2018 Oliver Pila, MD

## 2018-05-15 NOTE — Progress Notes (Signed)
CSW received and acknowledges consult for EDPS of 9.  Consult screened out due to 9 on EDPS does not warrant a CSW consult.  MOB whom scores are greater than 9/yes to question 10 on Edinburgh Postpartum Depression Screen warrants a CSW consult.   Please contact the Clinical Social Worker if needs arise or by MOB request.  Shlomie Romig Boyd-Gilyard, MSW, LCSW Clinical Social Work [phone] 

## 2018-05-15 NOTE — Progress Notes (Signed)
Subjective: Postpartum Day 2 Cesarean Delivery Patient reports tolerating PO, + flatus and no problems voiding.    Objective: Vital signs in last 24 hours: Temp:  [97.6 F (36.4 C)-98.2 F (36.8 C)] 97.6 F (36.4 C) (06/05 0529) Pulse Rate:  [68-85] 68 (06/05 0529) Resp:  [18-20] 18 (06/05 0529) BP: (126-128)/(70-80) 126/70 (06/05 0529) SpO2:  [100 %] 100 % (06/04 1800)  Physical Exam:  General: alert and cooperative Lochia: appropriate Uterine Fundus: firm Incision: c/d/i   Recent Labs    05/12/18 2325 05/14/18 0515  HGB 10.8* 9.2*  HCT 32.2* 27.5*    Assessment/Plan: Status post Cesarean section. Doing well postoperatively.  Discharge home with standard precautions and return to clinic in 2 weeks for incision check.  Oliver PilaKathy W Annslee Tercero 05/15/2018, 10:03 AM

## 2019-09-02 ENCOUNTER — Other Ambulatory Visit: Payer: Self-pay

## 2019-09-02 DIAGNOSIS — Z20822 Contact with and (suspected) exposure to covid-19: Secondary | ICD-10-CM

## 2019-09-03 LAB — NOVEL CORONAVIRUS, NAA: SARS-CoV-2, NAA: NOT DETECTED

## 2020-03-10 ENCOUNTER — Ambulatory Visit: Payer: 59

## 2020-04-17 DIAGNOSIS — Z720 Tobacco use: Secondary | ICD-10-CM | POA: Diagnosis not present

## 2020-07-19 DIAGNOSIS — Z1231 Encounter for screening mammogram for malignant neoplasm of breast: Secondary | ICD-10-CM | POA: Diagnosis not present

## 2020-07-19 DIAGNOSIS — Z130 Encounter for screening for diseases of the blood and blood-forming organs and certain disorders involving the immune mechanism: Secondary | ICD-10-CM | POA: Diagnosis not present

## 2020-07-19 DIAGNOSIS — N912 Amenorrhea, unspecified: Secondary | ICD-10-CM | POA: Diagnosis not present

## 2020-07-19 DIAGNOSIS — Z1151 Encounter for screening for human papillomavirus (HPV): Secondary | ICD-10-CM | POA: Diagnosis not present

## 2020-07-19 DIAGNOSIS — Z124 Encounter for screening for malignant neoplasm of cervix: Secondary | ICD-10-CM | POA: Diagnosis not present

## 2020-07-19 DIAGNOSIS — Z01419 Encounter for gynecological examination (general) (routine) without abnormal findings: Secondary | ICD-10-CM | POA: Diagnosis not present

## 2020-07-19 LAB — RESULTS CONSOLE HPV: CHL HPV: NEGATIVE

## 2020-07-19 LAB — HM MAMMOGRAPHY

## 2020-07-19 LAB — HM PAP SMEAR

## 2020-10-07 DIAGNOSIS — N939 Abnormal uterine and vaginal bleeding, unspecified: Secondary | ICD-10-CM | POA: Diagnosis not present

## 2021-02-25 DIAGNOSIS — M25774 Osteophyte, right foot: Secondary | ICD-10-CM | POA: Diagnosis not present

## 2021-02-25 DIAGNOSIS — M25775 Osteophyte, left foot: Secondary | ICD-10-CM | POA: Diagnosis not present

## 2021-02-25 DIAGNOSIS — L600 Ingrowing nail: Secondary | ICD-10-CM | POA: Diagnosis not present

## 2021-04-25 ENCOUNTER — Ambulatory Visit: Payer: 59 | Admitting: Physician Assistant

## 2021-09-05 ENCOUNTER — Encounter: Payer: Self-pay | Admitting: Physician Assistant

## 2021-09-05 ENCOUNTER — Other Ambulatory Visit: Payer: Self-pay

## 2021-09-05 ENCOUNTER — Ambulatory Visit (INDEPENDENT_AMBULATORY_CARE_PROVIDER_SITE_OTHER): Payer: BC Managed Care – PPO | Admitting: Physician Assistant

## 2021-09-05 VITALS — BP 144/90 | HR 84 | Temp 98.3°F | Ht 70.25 in | Wt 214.5 lb

## 2021-09-05 DIAGNOSIS — M25561 Pain in right knee: Secondary | ICD-10-CM

## 2021-09-05 DIAGNOSIS — Z23 Encounter for immunization: Secondary | ICD-10-CM

## 2021-09-05 DIAGNOSIS — G8929 Other chronic pain: Secondary | ICD-10-CM | POA: Diagnosis not present

## 2021-09-05 DIAGNOSIS — Z720 Tobacco use: Secondary | ICD-10-CM | POA: Insufficient documentation

## 2021-09-05 DIAGNOSIS — M25562 Pain in left knee: Secondary | ICD-10-CM | POA: Diagnosis not present

## 2021-09-05 NOTE — Patient Instructions (Signed)
It was great to see you!  Please take as needed ibuprofen for your knee pain and swelling. Use the ACE bandage to apply compression when swelling is most bothersome.  A referral has been placed for you to see [facility]. Someone from there office will be in touch soon regarding your appointment with him. His location: Psychiatrist Medicine at [facility] [address] on the 1st floor.   Phone number [phone], Fax [phone].  This location is across the street from the entrance to [employer] and in the same complex as the [facility] and [employer] bank  Take care,  [name] PA-C

## 2021-09-05 NOTE — Progress Notes (Signed)
[name] is a 45 y.o. female here to establish care.  I acted as a Neurosurgeon for [employer], PA-C [employer], LPN   History of Present Illness:   Chief Complaint  Patient presents with   Establish Care   Knee Pain    Knee pain Pt c/o bilateral knee pain x several months. L > R. Pt says left knee swells at times. Pt is using Tylenol as needed for pain. If she needs to squat or bend, she has worsening symptoms. Symptoms are generally worse as the day goes on. Symptoms are overall worsening with time. Denies prior surgeries.  Immunizations -- UTD, will give Flu shot today Colonoscopy -- N/A Mammogram -- UTD, will get report from GYN PAP -- UTD, will get report from GYN Bone Density -- N/A Weight -- Weight: 214 lb 8 oz (97.3 kg)   Depression screen PHQ 2/9 09/05/2021  Decreased Interest 3  Down, Depressed, Hopeless 2  PHQ - 2 Score 5  Altered sleeping 3  Tired, decreased energy 3  Change in appetite 1  Feeling bad or failure about yourself  2  Trouble concentrating 1  Moving slowly or fidgety/restless 0  Suicidal thoughts 0  PHQ-9 Score 15  Difficult doing work/chores Somewhat difficult    GAD 7 : Generalized Anxiety Score 09/05/2021  Nervous, Anxious, on Edge 2  Control/stop worrying 2  Worry too much - different things 3  Trouble relaxing 3  Restless 1  Easily annoyed or irritable 3  Afraid - awful might happen 2  Total GAD 7 Score 16  Anxiety Difficulty Somewhat difficult     Other providers/specialists: Patient Care Team: [name], [place] as PCP - [facility], [facility] as Consulting Physician (Gynecology) Center, [facility] as Consulting Physician (Ophthalmology)   Past Medical History:  Diagnosis Date   Anxiety    Chicken pox    Depression 05/14/2018   Frequent headaches    3-4 days per week; years   GERD (gastroesophageal reflux disease)    diet dependent   Hyperlipidemia    Miscarriage 2005    Vaginal delivery    2006, 2008     Social History   Tobacco Use   Smoking status: Every Day    Packs/day: 1.00    Years: 30.00    Pack years: 30.00    Types: Cigarettes   Smokeless tobacco: Never  Vaping Use   Vaping Use: Never used  Substance Use Topics   Alcohol use: Yes    Alcohol/week: 5.0 standard drinks    Types: 2 Glasses of wine, 3 Standard drinks or equivalent per week   Drug use: No    Past Surgical History:  Procedure Laterality Date   APPENDECTOMY     CESAREAN SECTION N/A 05/13/2018   Procedure: CESAREAN SECTION;  Surgeon: Huel Cote, MD;  Location: [facility] SU[facility];  Service: Obstetrics;  Laterality: N/A;   TONSILLECTOMY      Family History  Problem Relation Age of Onset   Arthritis Mother    Alcohol abuse Mother    Hypertension Mother    Mental illness Mother    Anxiety disorder Mother    Depression Mother    Osteoporosis Mother    Alcohol abuse Father    Arthritis Maternal Grandmother    Breast cancer Maternal Grandmother    Diabetes Maternal Grandfather    Heart disease Maternal Grandfather    Diabetes Paternal Grandmother    Colon cancer Neg Hx  No Known Allergies   Current Medications:   Current Outpatient Medications:    acetaminophen (TYLENOL) 325 MG tablet, Take 2 tablets (650 mg total) by mouth every 4 (four) hours as needed (for pain scale < 4)., Disp: 30 tablet, Rfl: 1   Multiple Vitamin (MULTIVITAMIN) tablet, Take 2 tablets by mouth daily. [employer] Women's Ultra Mega, Disp: , Rfl:    Review of Systems:   ROS Negative unless otherwise specified per HPI.  Vitals:   Vitals:   09/05/21 1437  BP: (!) 144/90  Pulse: 84  Temp: 98.3 F (36.8 C)  TempSrc: Temporal  Weight: 214 lb 8 oz (97.3 kg)  Height: 5' 10.25" (1.784 m)      Body mass index is 30.56 kg/m.  Physical Exam:   Physical Exam Vitals and nursing note reviewed.  Constitutional:      General: She is not in acute distress.    Appearance: She is  well-developed. She is not ill-appearing or toxic-appearing.  Cardiovascular:     Rate and Rhythm: Normal rate and regular rhythm.     Pulses: Normal pulses.     Heart sounds: Normal heart sounds, S1 normal and S2 normal.  Pulmonary:     Effort: Pulmonary effort is normal.     Breath sounds: Normal breath sounds.  Musculoskeletal:     Comments: L knee with lateral swelling Normal ROM of bilateral knees No joint line tenderness appreciated in bilateral knees No calf tenderness or swelling appreciated bilaterally   Skin:    General: Skin is warm and dry.  Neurological:     Mental Status: She is alert.     GCS: GCS eye subscore is 4. GCS verbal subscore is 5. GCS motor subscore is 6.  Psychiatric:        Speech: Speech normal.        Behavior: Behavior normal. Behavior is cooperative.    Assessment and Plan:   [name] was seen today for establish care and knee pain.  Diagnoses and all orders for this visit:  Chronic pain of both knees -     Ambulatory referral to Sports Medicine  Possible OA Referral to sports medicine given pronounced swelling of L knee Will defer imaging and intervention to them ACE bandage for compression NSAIDs prn for pain and swelling  CMA or LPN served as scribe during this visit. History, Physical, and Plan performed by medical provider. The above documentation has been reviewed and is accurate and complete.  Morgan Motto, PA-C

## 2021-09-09 NOTE — Progress Notes (Signed)
I, Philbert Riser, LAT, ATC acting as a scribe for Clementeen Graham, MD.  Subjective:    CC: Bilat knee pain  HPI: Pt is a 45 y/o female c/o bilat knee pain, L>R, x several months. Pt notes no prior knee injury or present MOI. Pt locates pain to the anterior aspect of bilat knees.  Pain is worse with climbing stairs and sitting for prolonged period of time.  She notes her left knee is swollen.    Swelling: yes- L>R Mechanical symptoms: yes Aggravates: bending/squatting, worsens later in the day, kneeling Treatments tried: Tylenol, IBU  Pertinent review of Systems: No fevers or chills  Relevant historical information: BMI greater than 30.  Patient works as a Administrator, [employer].   Objective:    Vitals:   09/12/21 0829  BP: (!) 138/96   General: Well Developed, well nourished, and in no acute distress.   MSK: Right knee minimal effusion normal-appearing otherwise. Normal motion with crepitation. Intact strength.  Pain with resisted extension Stable ligamentous exam. Negative McMurray's test  Left knee moderate effusion normal-appearing otherwise. Normal motion with crepitation. Intact strength pain with resisted extension. Stable ligamentous exam. Negative McMurray's test.  Lab and Radiology Results  Procedure: Real-time Ultrasound Guided Injection of right knee superior lateral patellar space Device: Philips Affiniti 50G Images permanently stored and available for review in PACS Ultrasound evaluation prior to injection reveals mild joint effusion.  Slight degeneration medial and lateral joint line.  No Baker's cyst. Verbal informed consent obtained.  Discussed risks and benefits of procedure. Warned about infection bleeding damage to structures skin hypopigmentation and fat atrophy among others. Patient expresses understanding and agreement Time-out conducted.   Noted no overlying erythema, induration, or other signs of local infection.   Skin prepped in  a sterile fashion.   Local anesthesia: Topical Ethyl chloride.   With sterile technique and under real time ultrasound guidance: 40 mg of Kenalog and 2 ml of lidocaine injected into knee joint. Fluid seen entering the joint capsule.   Completed without difficulty   Pain immediately resolved suggesting accurate placement of the medication.   Advised to call if fevers/chills, erythema, induration, drainage, or persistent bleeding.   Images permanently stored and available for review in the ultrasound unit.  Impression: Technically successful ultrasound guided injection.    Procedure: Real-time Ultrasound Guided Injection of left knee superior lateral patellar space Device: Philips Affiniti 50G Images permanently stored and available for review in PACS Ultrasound evaluation prior to injection review reveals moderate joint effusion.  Slightly narrowed medial and lateral joint lines.  No Baker's cyst. Verbal informed consent obtained.  Discussed risks and benefits of procedure. Warned about infection bleeding damage to structures skin hypopigmentation and fat atrophy among others. Patient expresses understanding and agreement Time-out conducted.   Noted no overlying erythema, induration, or other signs of local infection.   Skin prepped in a sterile fashion.   Local anesthesia: Topical Ethyl chloride.   With sterile technique and under real time ultrasound guidance: 40 mg of Kenalog and 2 ml of lidocaine injected into knee joint. Fluid seen entering the joint capsule.   Completed without difficulty   Pain immediately resolved suggesting accurate placement of the medication.   Advised to call if fevers/chills, erythema, induration, drainage, or persistent bleeding.   Images permanently stored and available for review in the ultrasound unit.  Impression: Technically successful ultrasound guided injection.    X-ray images bilateral knees obtained today personally and independently  interpreted  Right  knee: Mild DJD.  No acute fractures.  Left knee: Mild DJD.  No acute fractures.  Await formal radiology review    Impression and Recommendations:    Assessment and Plan: 45 y.o. female with bilateral knee pain with left knee effusion.  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  Plan for bilateral steroid injection and referral to PT for quad strengthening.  Also recommend weight loss and provided information for [place] healthy weight and wellness.  Also recommend Voltaren gel.  Recheck back in about 6 weeks.Marland Kitchen  PDMP not reviewed this encounter. Orders Placed This Encounter  Procedures   [place] LIMITED JOINT SPACE STRUCTURES LOW BILAT(NO LINKED CHARGES)    Standing Status:   Future    Number of Occurrences:   1    Standing Expiration Date:   03/13/2022    Order Specific Question:   Reason for Exam (SYMPTOM  OR DIAGNOSIS REQUIRED)    Answer:   bilateral knee pain    Order Specific Question:   Preferred imaging location?    Answer:   [facility] Knee AP/LAT W/Sunrise Left    Standing Status:   Future    Number of Occurrences:   1    Standing Expiration Date:   09/12/2022    Order Specific Question:   Reason for Exam (SYMPTOM  OR DIAGNOSIS REQUIRED)    Answer:   bilateral knee pain    Order Specific Question:   Preferred imaging location?    Answer:   [facility]    Order Specific Question:   Is patient pregnant?    Answer:   No   DG Knee AP/LAT W/Sunrise Right    Standing Status:   Future    Number of Occurrences:   1    Standing Expiration Date:   09/12/2022    Order Specific Question:   Reason for Exam (SYMPTOM  OR DIAGNOSIS REQUIRED)    Answer:   bilateral knee pain    Order Specific Question:   Preferred imaging location?    Answer:   [facility]    Order Specific Question:   Is patient pregnant?    Answer:   No   Ambulatory referral to Physical  Therapy    Referral Priority:   Routine    Referral Type:   Physical Medicine    Referral Reason:   Specialty Services Required    Requested Specialty:   Physical Therapy    Number of Visits Requested:   1   No orders of the defined types were placed in this encounter.   Discussed warning signs or symptoms. Please see discharge instructions. Patient expresses understanding.   The above documentation has been reviewed and is accurate and complete Clementeen Graham, M.D.

## 2021-09-12 ENCOUNTER — Ambulatory Visit: Payer: Self-pay

## 2021-09-12 ENCOUNTER — Other Ambulatory Visit: Payer: Self-pay

## 2021-09-12 ENCOUNTER — Ambulatory Visit: Payer: BC Managed Care – PPO | Admitting: Family Medicine

## 2021-09-12 ENCOUNTER — Ambulatory Visit (INDEPENDENT_AMBULATORY_CARE_PROVIDER_SITE_OTHER): Payer: BC Managed Care – PPO

## 2021-09-12 VITALS — BP 138/96 | Ht 70.25 in | Wt 216.4 lb

## 2021-09-12 DIAGNOSIS — M25561 Pain in right knee: Secondary | ICD-10-CM

## 2021-09-12 DIAGNOSIS — M25462 Effusion, left knee: Secondary | ICD-10-CM | POA: Diagnosis not present

## 2021-09-12 DIAGNOSIS — M25562 Pain in left knee: Secondary | ICD-10-CM

## 2021-09-12 DIAGNOSIS — M25461 Effusion, right knee: Secondary | ICD-10-CM | POA: Diagnosis not present

## 2021-09-12 DIAGNOSIS — Z6830 Body mass index (BMI) 30.0-30.9, adult: Secondary | ICD-10-CM | POA: Diagnosis not present

## 2021-09-12 DIAGNOSIS — G8929 Other chronic pain: Secondary | ICD-10-CM

## 2021-09-12 NOTE — Patient Instructions (Signed)
Thank you for coming in today.   I've referred you to Physical Therapy.  Let us know if you don't hear from them in one week.   [facility].  Address: [address] [place], [place], [place] [address] Phone: [phone]  Recheck in 6 weeks.   Please use Voltaren gel (Generic Diclofenac Gel) up to 4x daily for pain as needed.  This is available over-the-counter as both the name brand Voltaren gel and the generic diclofenac gel.   I've referred you to Physical Therapy.  Let us know if you don't hear from them in one week.   Please get an Xray today before you leave

## 2021-09-13 NOTE — Progress Notes (Signed)
Bilateral knee x-ray shows no acute abnormality.  Small joint effusion is present 

## 2021-09-13 NOTE — Progress Notes (Signed)
Bilateral knee x-ray shows no acute abnormality.  Small joint effusion is present

## 2021-09-16 ENCOUNTER — Other Ambulatory Visit: Payer: Self-pay

## 2021-09-16 ENCOUNTER — Ambulatory Visit (INDEPENDENT_AMBULATORY_CARE_PROVIDER_SITE_OTHER): Payer: BC Managed Care – PPO | Admitting: Rehabilitative and Restorative Service Providers"

## 2021-09-16 ENCOUNTER — Encounter: Payer: Self-pay | Admitting: Rehabilitative and Restorative Service Providers"

## 2021-09-16 DIAGNOSIS — M25562 Pain in left knee: Secondary | ICD-10-CM

## 2021-09-16 DIAGNOSIS — R262 Difficulty in walking, not elsewhere classified: Secondary | ICD-10-CM

## 2021-09-16 DIAGNOSIS — M25561 Pain in right knee: Secondary | ICD-10-CM

## 2021-09-16 DIAGNOSIS — M6281 Muscle weakness (generalized): Secondary | ICD-10-CM | POA: Diagnosis not present

## 2021-09-16 DIAGNOSIS — G8929 Other chronic pain: Secondary | ICD-10-CM

## 2021-09-16 NOTE — Patient Instructions (Signed)
Access Code: [email or web address] URL: [email or web address] Date: 09/16/2021 Prepared by: Chyrel Masson  Exercises Seated Straight Leg Heel Taps - 2 x daily - 7 x weekly - 3 sets - 10 reps Sit to Stand - 2 x daily - 7 x weekly - 3 sets - 10 reps Lateral Step Down - 2 x daily - 7 x weekly - 3 sets - 10 reps Single Leg Stance - 2 x daily - 7 x weekly - 1 sets - 5 reps - 30 hold

## 2021-09-16 NOTE — Therapy (Addendum)
[facility] [address] [place], [place], [address] Phone: [phone]   Fax:  [phone]  Physical Therapy Evaluation /Discharge   Patient Details  Name: [name] MRN: [id number] Date of Birth: 1976/01/13 Referring Provider (PT): Gregor Hams, MD   Encounter Date: 09/16/2021   PT End of Session - 09/16/21 1338     Visit Number 1    Number of Visits 20    Date for PT Re-Evaluation 11/25/21    Authorization Type BCBS 40% coinsurance    PT Start Time 6381    PT Stop Time 7711    PT Time Calculation (min) 35 min    Activity Tolerance Patient tolerated treatment well    Behavior During Therapy [facility] for tasks assessed/performed             Past Medical History:  Diagnosis Date   Anxiety    Chicken pox    Depression 05/14/2018   Frequent headaches    3-4 days per week; years   GERD (gastroesophageal reflux disease)    diet dependent   Hyperlipidemia    Miscarriage 2005   Vaginal delivery    2006, 2008    Past Surgical History:  Procedure Laterality Date   APPENDECTOMY     CESAREAN SECTION N/A 05/13/2018   Procedure: CESAREAN SECTION;  Surgeon: Paula Compton, MD;  Location: [facility];  Service: Obstetrics;  Laterality: N/A;   TONSILLECTOMY      There were no vitals filed for this visit.    Subjective Assessment - 09/16/21 1337     Subjective Referral for complaints of bilateral knee pain.   Pt. stated several months of trouble related to knees.  Pt. stated noticing swelling in Lt knee.  Complaints of pain c stairs, kneeling to ground for kid, lying down at night pain noticed as well.    Pertinent History Ultrasound evaluation prior to injection reveals mild joint effusion.  Slight degeneration medial and lateral joint line.  No Baker's cyst.    Limitations Lifting;Standing;Walking;House hold activities    Patient Stated Goals Reduce pain    Currently in Pain? Yes    Pain Score 7    pain at worst   Pain  Location Knee    Pain Orientation Left;Right;Anterior    Pain Descriptors / Indicators Sharp    Pain Type Chronic pain    Pain Onset More than a month ago    Aggravating Factors  bending, stairs, kneeling, sleeping.    Pain Relieving Factors Injections helps for several times                [facility] PT Assessment - 09/16/21 0001       Assessment   Medical Diagnosis M25.561,M25.562,G89.29 (ICD-10-CM) - Bilateral chronic knee pain    Referring Provider (PT) Gregor Hams, MD    Onset Date/Surgical Date 07/11/21    Hand Dominance Left      Precautions   Precautions None      Restrictions   Weight Bearing Restrictions No      Balance Screen   Has the patient fallen in the past 6 months No    Has the patient had a decrease in activity level because of a fear of falling?  No    Is the patient reluctant to leave their home because of a fear of falling?  No      Home Environment   Living Environment Private residence    Additional Comments stairs to bedroom  Prior Scientist, clinical (histocompatibility and immunogenetics) c sitting required    Leisure Child care for children, housework.  No specific workout routine.      Observation/Other Assessments   Focus on Therapeutic Outcomes (FOTO)  intake 64%,predicted 69%      Sensation   Light Touch Appears Intact      Functional Tests   Functional tests Sit to Stand;Single leg stance;Step down      Step Down   Comments fair control in eccentric step down from 6 inch step bilateral, noted valgus movement and instability in control bilateral      Single Leg Stance   Comments Lt SLS: 5 seconds, Rt SLS 4 seconds, general decrease in ankle strategy reaction      Sit to Stand   Comments 18 inch chair s UE no complaints, crepitus in knees      ROM / Strength   AROM / PROM / Strength Strength;PROM;AROM      AROM   Overall AROM Comments No complaints c knee AROM bilateral    AROM Assessment Site Knee    Right/Left Knee Left;Right     Right Knee Extension 0    Right Knee Flexion 140    Left Knee Extension 0    Left Knee Flexion 140      Strength   Strength Assessment Site Hip;Knee;Ankle    Right/Left Hip Right;Left    Right Hip Flexion 5/5    Left Hip Flexion 5/5    Right/Left Knee Left;Right    Right Knee Flexion 5/5    Right Knee Extension 5/5   70.6, 63 lbs   Left Knee Flexion 5/5    Left Knee Extension 4/5   43.6, 54 lbs   Right/Left Ankle Right;Left    Right Ankle Dorsiflexion 5/5    Left Ankle Dorsiflexion 5/5      Ambulation/Gait   Gait Comments Unremarkable                        Objective measurements completed on examination: See above findings.       [facility] PT Treatment/Exercise - 09/16/21 0001       Exercises   Exercises Other Exercises;Knee/Hip    Other Exercises  HEP instruction/performance c cues for techniques, handout provided.  Trial set performed of each for comprehension and symptom assessment.  HEP consisting of seated SLR bilateral, sit to stand fast up, slow down, single leg stance, lateral step down bilateral                     PT Education - 09/16/21 1337     Education Details HEP, POC    Person(s) Educated Patient    Methods Explanation;Demonstration;Verbal cues;Handout    Comprehension Returned demonstration;Verbalized understanding              PT Short Term Goals - 09/16/21 1338       PT SHORT TERM GOAL #1   Title Patient will demonstrate independent use of home exercise program to maintain progress from in clinic treatments.    Time 3    Period Weeks    Status New    Target Date 10/07/21               PT Long Term Goals - 09/16/21 1338       PT LONG TERM GOAL #1   Title Patient will demonstrate/report pain at worst less than or equal to 2/10 to facilitate  minimal limitation in daily activity secondary to pain symptoms.    Time 10    Period Weeks    Status New    Target Date 11/25/21      PT LONG TERM GOAL #2    Title Patient will demonstrate independent use of home exercise program to facilitate ability to maintain/progress functional gains from skilled physical therapy services.    Time 10    Period Weeks    Status New    Target Date 11/25/21      PT LONG TERM GOAL #3   Title Pt. will demonstrate FOTO outcome > or = 69 % to indicated reduced disability due to condition.    Time 10    Period Weeks    Status New    Target Date 11/25/21      PT LONG TERM GOAL #4   Title Patient will demonstrate bilateral leg  MMT 5/5, Lt dynamometry with 15% of Rt knee extension to facilitate usual squat, stairs, walking at PLOF.    Time 10    Period Weeks    Status New    Target Date 11/25/21      PT LONG TERM GOAL #5   Title Patient will demonstrate B SLS > or = 15 seconds to facilitate stability in ambulation on even/uneven surfaces to reduce fall risk.    Time 10    Period Weeks    Status New    Target Date 11/25/21      Additional Long Term Goals   Additional Long Term Goals Yes      PT LONG TERM GOAL #6   Title Pt. will demonstrate reciprocal gait pattern at PLOF up/down stairs s deviation.    Time 10    Period Weeks    Status New    Target Date 11/25/21                    Plan - 09/16/21 1339     Clinical Impression Statement Patient is a 45 y.o. who comes to clinic with complaints of bilateral knee pain with mobility, strength and movement coordination deficits that impair their ability to perform usual daily and recreational functional activities without increase difficulty/symptoms at this time.  Patient to benefit from skilled PT services to address impairments and limitations to improve to previous level of function without restriction secondary to condition.    Examination-Activity Limitations Sleep;Bend;Squat;Stairs;Transfers;Locomotion Level;Lift    Examination-Participation Restrictions Cleaning;Community Activity;Interpersonal Relationship;Meal Prep     Stability/Clinical Decision Making Stable/Uncomplicated    Clinical Decision Making Low    Rehab Potential Good    PT Frequency Other (comment)   1-2x/week   PT Duration Other (comment)   10 weeks   PT Treatment/Interventions ADLs/Self Care Home Management;Cryotherapy;Iontophoresis 16m/ml Dexamethasone;Therapeutic exercise;Moist Heat;Balance training;Therapeutic activities;Functional mobility training;Electrical Stimulation;Stair training;Gait training;DME Instruction;Ultrasound;Neuromuscular re-education;Patient/family education;Passive range of motion;Spinal Manipulations;Joint Manipulations;Dry needling;Taping;Manual techniques    PT Next Visit Plan Progressive quad strengthening (machine LAQ) compliant surface balance.  Pt. does not have access to machine weight so keep in mind useful exercise for home.    PT Home Exercise Plan [email or web address]    Consulted and Agree with Plan of Care Patient             Patient will benefit from skilled therapeutic intervention in order to improve the following deficits and impairments:  Pain, Decreased activity tolerance, Decreased strength, Decreased endurance, Difficulty walking, Decreased mobility, Decreased balance, Decreased range of motion, Decreased coordination, Impaired flexibility, Improper body mechanics, Impaired  perceived functional ability  Visit Diagnosis: Chronic pain of left knee  Chronic pain of right knee  Muscle weakness (generalized)  Difficulty in walking, not elsewhere classified     Problem List Patient Active Problem List   Diagnosis Date Noted   Tobacco user 09/05/2021   Insomnia 01/08/2017   Headache, tension type, chronic 01/08/2017    Scot Jun, PT, DPT, OCS, ATC 09/16/21  2:25 PM  PHYSICAL THERAPY DISCHARGE SUMMARY  Visits from Start of Care: 1  Current functional level related to goals / functional outcomes: See note   Remaining deficits: See note   Education / Equipment: HEP   Patient agrees to  discharge. Patient goals were not met. Patient is being discharged due to not returning since the last visit.  Scot Jun, PT, DPT, OCS, ATC 12/13/21  1:23 PM     [facility] [address] [place], [place], [address] Phone: [phone]   Fax:  [phone]  Name: Litha Lamartina MRN: [id number] Date of Birth: November 30, 1976

## 2021-09-28 NOTE — Progress Notes (Signed)
Subjective:    [name] is a 45 y.o. female and is here for a comprehensive physical exam.   HPI  Health Maintenance Due  Topic Date Due   Pneumococcal Vaccine 18-52 Years old (1 - PCV) Never done   MAMMOGRAM  Never done   Hepatitis C Screening  Never done   PAP SMEAR-Modifier  Never done   COVID-19 Vaccine (3 - Booster for Moderna series) 10/20/2020    Acute Concerns: Insomnia Currently [name] is not getting as much sleep. She reports having a difficulty falling asleep as well as staying asleep. Patient has taken OTC medications to assist with this but still finds she wakes up multiple times a night.  Irritability In turn, [name] has expressed that she feels her anger and irritation are worsening. She has noticed feeling more non- tolerant especially with her co workers. Believes this could be related to lack of sleep, so she would like to work on increasing sleep first.  Denies SI/HI. Has never been on SSRIs in the past.   Diarrhea For the past couple of months she has been experiencing urgent diarrhea. [name] says she is experiencing this about 5 days x week, at work to be specific. Reports no changes in her diet but does say that prior to this she was experiencing a lot of constipation. Denies melena, hematochezia, rectal bleeding, or abdominal pain. Does not think that this is related to any specific foods.  Chronic Issues: None  Health Maintenance: Immunizations -- COVID- Last completed 05/20/20 (Moderna- 2 doses) Tdap- Last completed 02/27/18 Influenza Vaccine- Last completed 09/05/21. Colonoscopy -- N/A Mammogram -- UTD -- counseled PAP -- UTD -- counseled Bone Density -- N/A Opthalmology- UTD Dentistry- Currently not UTD Diet -- Has improved diet by cooking at home. Increased water intake.  Sleep habits -- Difficult to fall asleep and stay asleep as of late.  Exercise -- Difficult to fit in her schedule; currently not moving as much.  Weight --  Stable Wt Readings from Last 3 Encounters:  09/29/21 212 lb 3.2 oz (96.3 kg)  09/12/21 216 lb 6.4 oz (98.2 kg)  09/05/21 214 lb 8 oz (97.3 kg)    Mood -- Feels more irritable Weight history:  There is no height or weight on file to calculate BMI. No LMP recorded. (Menstrual status: Irregular Periods). Alcohol use:  reports current alcohol use of about 5.0 standard drinks per week. Tobacco use:One pack per day Tobacco Use: High Risk   Smoking Tobacco Use: Every Day   Smokeless Tobacco Use: Never     Depression screen [facility] 2/9 09/05/2021  Decreased Interest 3  Down, Depressed, Hopeless 2  PHQ - 2 Score 5  Altered sleeping 3  Tired, decreased energy 3  Change in appetite 1  Feeling bad or failure about yourself  2  Trouble concentrating 1  Moving slowly or fidgety/restless 0  Suicidal thoughts 0  PHQ-9 Score 15  Difficult doing work/chores Somewhat difficult     Other providers/specialists: Patient Care Team: [name], [place] as PCP - [facility], [facility] as Consulting Physician (Gynecology) Center, [facility] as Consulting Physician (Ophthalmology)    PMHx, SurgHx, SocialHx, Medications, and Allergies were reviewed in the Visit Navigator and updated as appropriate.   Past Medical History:  Diagnosis Date   Anxiety    Chicken pox    Depression 05/14/2018   Frequent headaches    3-4 days per week; years   GERD (gastroesophageal reflux disease)  diet dependent   Hyperlipidemia    Miscarriage 2005   Vaginal delivery    2006, 2008     Past Surgical History:  Procedure Laterality Date   APPENDECTOMY     CESAREAN SECTION N/A 05/13/2018   Procedure: CESAREAN SECTION;  Surgeon: Huel Cote, MD;  Location: [facility];  Service: Obstetrics;  Laterality: N/A;   TONSILLECTOMY       Family History  Problem Relation Age of Onset   Arthritis Mother    Alcohol abuse Mother    Hypertension Mother     Mental illness Mother    Anxiety disorder Mother    Depression Mother    Osteoporosis Mother    Alcohol abuse Father    Arthritis Maternal Grandmother    Breast cancer Maternal Grandmother    Diabetes Maternal Grandfather    Heart disease Maternal Grandfather    Diabetes Paternal Grandmother    Colon cancer Neg Hx     Social History   Tobacco Use   Smoking status: Every Day    Packs/day: 1.00    Years: 30.00    Pack years: 30.00    Types: Cigarettes   Smokeless tobacco: Never  Vaping Use   Vaping Use: Never used  Substance Use Topics   Alcohol use: Yes    Alcohol/week: 5.0 standard drinks    Types: 2 Glasses of wine, 3 Standard drinks or equivalent per week   Drug use: No    Review of Systems:   Review of Systems  Constitutional:  Negative for chills, fever, malaise/fatigue and weight loss.  HENT:  Negative for hearing loss, sinus pain and sore throat.   Respiratory:  Negative for cough and hemoptysis.   Cardiovascular:  Negative for chest pain, palpitations, leg swelling and PND.  Gastrointestinal:  Positive for diarrhea. Negative for abdominal pain, constipation, heartburn, nausea and vomiting.  Genitourinary:  Negative for dysuria, frequency and urgency.  Musculoskeletal:  Negative for back pain, myalgias and neck pain.  Skin:  Negative for itching and rash.  Neurological:  Negative for dizziness, tingling, seizures and headaches.  Endo/Heme/Allergies:  Negative for polydipsia.  Psychiatric/Behavioral:  Negative for depression. The patient has insomnia. The patient is not nervous/anxious.   Negative unless otherwise specified per HPI.  Objective:   There were no vitals taken for this visit. There is no height or weight on file to calculate BMI.   General Appearance:    Alert, cooperative, no distress, appears stated age  Head:    Normocephalic, without obvious abnormality, atraumatic  Eyes:    PERRL, conjunctiva/corneas clear, EOM's intact, fundi    benign,  both eyes  Ears:    Normal TM's and external ear canals, both ears  Nose:   Nares normal, septum midline, mucosa normal, no drainage    or sinus tenderness  Throat:   Lips, mucosa, and tongue normal; teeth and gums normal  Neck:   Supple, symmetrical, trachea midline, no adenopathy;    thyroid:  no enlargement/tenderness/nodules; no carotid   bruit or JVD  Back:     Symmetric, no curvature, ROM normal, no CVA tenderness  Lungs:     Clear to auscultation bilaterally, respirations unlabored  Chest Wall:    No tenderness or deformity   Heart:    Regular rate and rhythm, S1 and S2 normal, no murmur, rub or gallop  Breast Exam:    Deferred  Abdomen:     Soft, non-tender, bowel sounds active all four quadrants,    no  masses, no organomegaly  Genitalia:    Deferred  Extremities:   Extremities normal, atraumatic, no cyanosis or edema  Pulses:   2+ and symmetric all extremities  Skin:   Skin color, texture, turgor normal, no rashes or lesions  Lymph nodes:   Cervical, supraclavicular, and axillary nodes normal  Neurologic:   CNII-XII intact, normal strength, sensation and reflexes    throughout    Assessment/Plan:   Encounter for general adult medical examination with abnormal findings Today patient counseled on age appropriate routine health concerns for screening and prevention, each reviewed and up to date or declined. Immunizations reviewed and up to date or declined. Labs ordered and reviewed. Risk factors for depression reviewed and negative. Hearing function and visual acuity are intact. ADLs screened and addressed as needed. Functional ability and level of safety reviewed and appropriate. Education, counseling and referrals performed based on assessed risks today. Patient provided with a copy of personalized plan for preventive services...   Tobacco user Encouraged cessation   Obesity Continue efforts at healthy lifestyle  Insomnia, unspecified type Uncontrolled Trial trazodone  50 mg daily Follow-up as needed  Irritability Uncontrolled Declines need for intervention, will trial trazodone for sleep Follow-up as needed If worsening, please let us know  Diarrhea, unspecified type Uncontrolled No red flags Recommend oral probiotic and/or additional fiber If any worsening, was told to let us know   Patient Counseling: [x]    Nutrition: Stressed importance of moderation in sodium/caffeine intake, saturated fat and cholesterol, caloric balance, sufficient intake of fresh fruits, vegetables, fiber, calcium, iron, and 1 mg of folate supplement per day (for females capable of pregnancy).  [x]    Stressed the importance of regular exercise.   [x]    Substance Abuse: Discussed cessation/primary prevention of tobacco, alcohol, or other drug use; driving or other dangerous activities under the influence; availability of treatment for abuse.   [x]    Injury prevention: Discussed safety belts, safety helmets, smoke detector, smoking near bedding or upholstery.   [x]    Sexuality: Discussed sexually transmitted diseases, partner selection, use of condoms, avoidance of unintended pregnancy  and contraceptive alternatives.  [x]    Dental health: Discussed importance of regular tooth brushing, flossing, and dental visits.  [x]    Health maintenance and immunizations reviewed. Please refer to Health maintenance section.   I,Havlyn C Ratchford,acting as a for , PA.,have documented all relevant documentation on the behalf of , PA,as directed by  , PA while in the presence of , .  I, , Neurosurgeon, have reviewed all documentation for this visit. The documentation on 09/29/21 for the exam, diagnosis, procedures, and orders are all accurate and complete.  [name], PA-C [facility]

## 2021-09-29 ENCOUNTER — Ambulatory Visit (INDEPENDENT_AMBULATORY_CARE_PROVIDER_SITE_OTHER): Payer: BC Managed Care – PPO | Admitting: Physician Assistant

## 2021-09-29 ENCOUNTER — Encounter: Payer: Self-pay | Admitting: Physician Assistant

## 2021-09-29 ENCOUNTER — Other Ambulatory Visit: Payer: Self-pay

## 2021-09-29 VITALS — BP 124/70 | HR 70 | Temp 98.0°F | Ht 70.25 in | Wt 212.2 lb

## 2021-09-29 DIAGNOSIS — G4700 Insomnia, unspecified: Secondary | ICD-10-CM

## 2021-09-29 DIAGNOSIS — R197 Diarrhea, unspecified: Secondary | ICD-10-CM

## 2021-09-29 DIAGNOSIS — Z0001 Encounter for general adult medical examination with abnormal findings: Secondary | ICD-10-CM | POA: Diagnosis not present

## 2021-09-29 DIAGNOSIS — E669 Obesity, unspecified: Secondary | ICD-10-CM

## 2021-09-29 DIAGNOSIS — R454 Irritability and anger: Secondary | ICD-10-CM

## 2021-09-29 DIAGNOSIS — Z720 Tobacco use: Secondary | ICD-10-CM

## 2021-09-29 LAB — COMPREHENSIVE METABOLIC PANEL
ALT: 33 U/L (ref 0–35)
AST: 24 U/L (ref 0–37)
Albumin: 4.3 g/dL (ref 3.5–5.2)
Alkaline Phosphatase: 63 U/L (ref 39–117)
BUN: 9 mg/dL (ref 6–23)
CO2: 26 mEq/L (ref 19–32)
Calcium: 9.6 mg/dL (ref 8.4–10.5)
Chloride: 104 mEq/L (ref 96–112)
Creatinine, Ser: 0.81 mg/dL (ref 0.40–1.20)
GFR: 89.07 mL/min (ref >60.00)
Glucose, Bld: 85 mg/dL (ref 70–99)
Potassium: 4.3 mEq/L (ref 3.5–5.1)
Sodium: 137 mEq/L (ref 135–145)
Total Bilirubin: 0.4 mg/dL (ref 0.2–1.2)
Total Protein: 7.1 g/dL (ref 6.0–8.3)

## 2021-09-29 LAB — CBC WITH DIFFERENTIAL/PLATELET
Basophils Absolute: 0 10*3/uL (ref 0.0–0.1)
Basophils Relative: 0.5 % (ref 0.0–3.0)
Eosinophils Absolute: 0.1 10*3/uL (ref 0.0–0.7)
Eosinophils Relative: 1.5 % (ref 0.0–5.0)
HCT: 44 % (ref 36.0–46.0)
Hemoglobin: 14.5 g/dL (ref 12.0–15.0)
Lymphocytes Relative: 37.4 % (ref 12.0–46.0)
Lymphs Abs: 2.5 10*3/uL (ref 0.7–4.0)
MCHC: 32.9 g/dL (ref 30.0–36.0)
MCV: 88.2 fl (ref 78.0–100.0)
Monocytes Absolute: 0.4 10*3/uL (ref 0.1–1.0)
Monocytes Relative: 6.3 % (ref 3.0–12.0)
Neutro Abs: 3.6 10*3/uL (ref 1.4–7.7)
Neutrophils Relative %: 54.3 % (ref 43.0–77.0)
Platelets: 221 10*3/uL (ref 150.0–400.0)
RBC: 4.99 Mil/uL (ref 3.87–5.11)
RDW: 13.7 % (ref 11.5–15.5)
WBC: 6.6 10*3/uL (ref 4.0–10.5)

## 2021-09-29 LAB — LIPID PANEL
Cholesterol: 195 mg/dL (ref 0–200)
HDL: 42.5 mg/dL (ref >39.00)
NonHDL: 152.06
Total CHOL/HDL Ratio: 5
Triglycerides: 258 mg/dL — ABNORMAL HIGH (ref 0.0–149.0)
VLDL: 51.6 mg/dL — ABNORMAL HIGH (ref 0.0–40.0)

## 2021-09-29 LAB — LDL CHOLESTEROL, DIRECT: Direct LDL: 93 mg/dL

## 2021-09-29 MED ORDER — TRAZODONE HCL 50 MG PO TABS: 50.0000 mg | ORAL_TABLET | Freq: Every day | ORAL | 1 refills | Status: DC

## 2021-09-29 NOTE — Patient Instructions (Addendum)
It was great to see you!  *please make sure you have gotten a mammogram this year *trial Trazodone for sleepy *trial probiotic and/or more fiber for diarrhea  Please go to the lab for blood work.   Our office will call you with your results unless you have chosen to receive results via MyChart.  If your blood work is normal we will follow-up each year for physicals and as scheduled for chronic medical problems.  If anything is abnormal we will treat accordingly and get you in for a follow-up.  Take care,  Lelon Mast

## 2021-09-30 ENCOUNTER — Encounter: Payer: BC Managed Care – PPO | Admitting: Rehabilitative and Restorative Service Providers"

## 2021-10-07 ENCOUNTER — Telehealth: Payer: Self-pay | Admitting: Rehabilitative and Restorative Service Providers"

## 2021-10-07 ENCOUNTER — Encounter: Payer: BC Managed Care – PPO | Admitting: Rehabilitative and Restorative Service Providers"

## 2021-10-07 NOTE — Telephone Encounter (Signed)
Called and left VM regarding today's missed appointment.  Asked her to call and confirm her next appointment next Friday at the same time (8:45 AM).  Left [phone] direct number for Kisha.  Also asked her to please cancel if unable to attend to open that spot up, although we hope she is able to attend.

## 2021-10-14 ENCOUNTER — Encounter: Payer: BC Managed Care – PPO | Admitting: Rehabilitative and Restorative Service Providers"

## 2021-10-20 ENCOUNTER — Encounter: Payer: BC Managed Care – PPO | Admitting: Rehabilitative and Restorative Service Providers"

## 2021-10-24 ENCOUNTER — Ambulatory Visit: Payer: BC Managed Care – PPO | Admitting: Family Medicine

## 2021-10-27 ENCOUNTER — Encounter: Payer: BC Managed Care – PPO | Admitting: Rehabilitative and Restorative Service Providers"

## 2022-03-01 NOTE — Progress Notes (Signed)
[name] is a 46 y.o. female here for anxiety/depression. ? ?History of Present Illness:  ? ?Chief Complaint  ?Patient presents with  ? Anxiety  ?  Pt stated that the medication works on some days but if her anxiety is on a 10 it does not work. The trazadone does not keep her sleep long enough to where she feels like she got a good nights rest. She wakes up most of the nights  ? Depression  ? ? ?HPI ? ?Anxiety/Depression ?[name] expresses she has been experiencing increased feelings of anxiety and guilt for the past couple of months that has made it hard for her to make it to work at times. States that she has been feeling overwhelmed with everything despite having great support system at work and home. Upon further discussion, pt got teary eyed when it comes to constant guilt for taking time for herself although she knows that this is vital in the long run. Reports that even when she tries to take time for herself, she feels that she constantly has to do something else such a folding laundry or finishing up paperwork. There was talk of possible ADHD, but [name] has never thought about this upon looking back on her regular behaviors. In the past she had trialed wellbutrin but found this medication didn't improve or worsen her sx. Aside from this she is interested in trialing another medication in hopes of finding some additional relief. Denies SI/HI.   ? ? ?Insomnia ?Since our prior visit on 09/29/21, pt has remained compliant with taking trazodone 50 mg nightly, about 30 minutes prior to bedtime, with no adverse effects. Although she has found the medication helps her fall asleep, she is still waking up in the middle of the night around 3 or 4 am. States that she would wake up either once or twice and stay awake for about 15-20 minutes before falling back into a very light sleep similar to a doze off. Upon further discussion, she has admitted that during evenings where her anxiety is at a 10, she is not able  to sleep at all despite using the medication. Currently she is interested in increasing this dosage.  ? ? ?Past Medical History:  ?Diagnosis Date  ? Anxiety   ? Chicken pox   ? Depression 05/14/2018  ? Frequent headaches   ? 3-4 days per week; years  ? GERD (gastroesophageal reflux disease)   ? diet dependent  ? Hyperlipidemia   ? Miscarriage 2005  ? Vaginal delivery   ? 2006, 2008  ? ?  ?Social History  ? ?Tobacco Use  ? Smoking status: Every Day  ?  Packs/day: 1.00  ?  Years: 30.00  ?  Pack years: 30.00  ?  Types: Cigarettes  ? Smokeless tobacco: Never  ?Vaping Use  ? Vaping Use: Never used  ?Substance Use Topics  ? Alcohol use: Yes  ?  Alcohol/week: 5.0 standard drinks  ?  Types: 2 Glasses of wine, 3 Standard drinks or equivalent per week  ? Drug use: No  ? ? ?Past Surgical History:  ?Procedure Laterality Date  ? APPENDECTOMY    ? CESAREAN SECTION N/A 05/13/2018  ? Procedure: CESAREAN SECTION;  Surgeon: Huel Cote, MD;  Location: [facility];  Service: Obstetrics;  Laterality: N/A;  ? TONSILLECTOMY    ? ? ?Family History  ?Problem Relation Age of Onset  ? Arthritis Mother   ? Alcohol abuse Mother   ? Hypertension Mother   ? Mental  illness Mother   ? Anxiety disorder Mother   ? Depression Mother   ? Osteoporosis Mother   ? Alcohol abuse Father   ? Arthritis Maternal Grandmother   ? Breast cancer Maternal Grandmother   ? Diabetes Maternal Grandfather   ? Heart disease Maternal Grandfather   ? Diabetes Paternal Grandmother   ? Colon cancer Neg Hx   ? ? ?No Known Allergies ? ?Current Medications:  ? ?Current Outpatient Medications:  ?  Multiple Vitamin (MULTIVITAMIN) tablet, Take 2 tablets by mouth daily. [employer] Women's Ultra Mega, Disp: , Rfl:  ?  traZODone (DESYREL) 50 MG tablet, Take 1 tablet (50 mg total) by mouth at bedtime., Disp: 90 tablet, Rfl: 1  ? ?Review of Systems:  ? ?ROS ?Negative unless otherwise specified per HPI. ?Vitals:  ? ?Vitals:  ? 03/02/22 0835  ?BP: 122/80  ?Pulse: 82  ?Temp: 97.9  ?F (36.6 ?C)  ?SpO2: 92%  ?Weight: 207 lb (93.9 kg)  ?Height: 5' 10.25" (1.784 m)  ?   ?Body mass index is 29.49 kg/m?. ? ?Physical Exam:  ? ?Physical Exam ?Vitals and nursing note reviewed.  ?Constitutional:   ?   General: She is not in acute distress. ?   Appearance: She is well-developed. She is not ill-appearing or toxic-appearing.  ?Cardiovascular:  ?   Rate and Rhythm: Normal rate and regular rhythm.  ?   Pulses: Normal pulses.  ?   Heart sounds: Normal heart sounds, S1 normal and S2 normal.  ?Pulmonary:  ?   Effort: Pulmonary effort is normal.  ?   Breath sounds: Normal breath sounds.  ?Skin: ?   General: Skin is warm and dry.  ?Neurological:  ?   Mental Status: She is alert.  ?   GCS: GCS eye subscore is 4. GCS verbal subscore is 5. GCS motor subscore is 6.  ?Psychiatric:     ?   Speech: Speech normal.     ?   Behavior: Behavior normal. Behavior is cooperative.  ? ? ?Assessment and Plan:  ? ?Insomnia, unspecified type ?Improving ?Increase Trazodone to 100 mg daily ?Consider adding in Melatonin if no improvement with dose increase  ?Follow up as needed  ? ?Anxiety and depression ?Uncontrolled ?No red flags;Denies SI/HI ?Start Zoloft 25 mg daily  ?Recommended patient look at ADHD self assessment and reach out if concerns ?I advised patient that if they develop any SI, to tell someone immediately and seek medical attention ?Follow up in 1 month via MyChart, sooner if concerns ? ?I,Havlyn C Ratchford,acting as a scribe for [employer], PA.,have documented all relevant documentation on the behalf of [name], PA,as directed by  [name], PA while in the presence of [name], [place]. ? ?IJarold Motto, PA, have reviewed all documentation for this visit. The documentation on 03/02/22 for the exam, diagnosis, procedures, and orders are all accurate and complete.  ? ?[name], PA-C ? ?

## 2022-03-02 ENCOUNTER — Encounter: Payer: Self-pay | Admitting: Physician Assistant

## 2022-03-02 ENCOUNTER — Ambulatory Visit (INDEPENDENT_AMBULATORY_CARE_PROVIDER_SITE_OTHER): Payer: BC Managed Care – PPO | Admitting: Physician Assistant

## 2022-03-02 VITALS — BP 122/80 | HR 82 | Temp 97.9°F | Ht 70.25 in | Wt 207.0 lb

## 2022-03-02 DIAGNOSIS — G4700 Insomnia, unspecified: Secondary | ICD-10-CM | POA: Diagnosis not present

## 2022-03-02 DIAGNOSIS — F419 Anxiety disorder, unspecified: Secondary | ICD-10-CM

## 2022-03-02 DIAGNOSIS — F32A Depression, unspecified: Secondary | ICD-10-CM

## 2022-03-02 MED ORDER — SERTRALINE HCL 25 MG PO TABS: 25.0000 mg | ORAL_TABLET | Freq: Every day | ORAL | 1 refills | Status: DC

## 2022-03-02 MED ORDER — TRAZODONE HCL 100 MG PO TABS: 100.0000 mg | ORAL_TABLET | Freq: Every day | ORAL | 1 refills | Status: DC

## 2022-03-02 NOTE — Patient Instructions (Signed)
It was great to see you! ? ?Increase Trazodone to 100 mg  ?Consider adding melatonin ? ?Start 25 mg zoloft ?Send me a message in a month on how you are doing, or you can make a visit if you'd like ? ?Look at the ADHD self-assessment and let me know if you resonate with the questions! ? ?Take care, ? ?Inda Coke PA-C  ?

## 2022-04-13 ENCOUNTER — Encounter: Payer: Self-pay | Admitting: *Deleted

## 2022-04-13 ENCOUNTER — Telehealth (INDEPENDENT_AMBULATORY_CARE_PROVIDER_SITE_OTHER): Payer: BC Managed Care – PPO | Admitting: Physician Assistant

## 2022-04-13 DIAGNOSIS — F32A Depression, unspecified: Secondary | ICD-10-CM

## 2022-04-13 DIAGNOSIS — G4700 Insomnia, unspecified: Secondary | ICD-10-CM

## 2022-04-13 DIAGNOSIS — F419 Anxiety disorder, unspecified: Secondary | ICD-10-CM | POA: Diagnosis not present

## 2022-04-13 MED ORDER — TRAZODONE HCL 100 MG PO TABS: 100.0000 mg | ORAL_TABLET | Freq: Every day | ORAL | 1 refills | Status: DC

## 2022-04-13 MED ORDER — CITALOPRAM HYDROBROMIDE 10 MG PO TABS: 10.0000 mg | ORAL_TABLET | Freq: Every day | ORAL | 1 refills | Status: DC

## 2022-04-13 NOTE — Progress Notes (Signed)
? ?  Virtual Visit via Video Note  ? ?IJarold Motto, connected with  [name]  ([id number], February 25, 1976) on 04/13/22 at  8:20 AM EDT by a video-enabled telemedicine application and verified that I am speaking with the correct person using two identifiers. ? ?Location: ?Patient: Home ?Provider: [facility] office ?  ?I discussed the limitations of evaluation and management by telemedicine and the availability of in person appointments. The patient expressed understanding and agreed to proceed.   ? ?History of Present Illness: ?[name] is a 46 y.o. who identifies as a female who was assigned female at birth, and is being seen today for anxiety/depression. ? ?Taking trazodone and doing well with this overall. The increase from 50 to 100 mg has helped her sleep and she is happy with this change. ? ?She started zoloft 25 mg after our last visit. She has had increased "jitteriness" taking this medication. At first it was worse, and it has slightly improved, but she would like to trial a different medication.  ? ?Denies chest pain, SOB, SI/HI. ? ?Problems:  ?Patient Active Problem List  ? Diagnosis Date Noted  ? Tobacco user 09/05/2021  ? Insomnia 01/08/2017  ? Headache, tension type, chronic 01/08/2017  ?  ?Allergies: No Known Allergies ?Medications:  ?Current Outpatient Medications:  ?  citalopram (CELEXA) 10 MG tablet, Take 1 tablet (10 mg total) by mouth daily., Disp: 30 tablet, Rfl: 1 ?  Multiple Vitamin (MULTIVITAMIN) tablet, Take 2 tablets by mouth daily. [employer] Women's Ultra Mega, Disp: , Rfl:  ?  traZODone (DESYREL) 100 MG tablet, Take 1 tablet (100 mg total) by mouth at bedtime., Disp: 90 tablet, Rfl: 1 ? ?Observations/Objective: ?Patient is well-developed, well-nourished in no acute distress.  ?Resting comfortably  at home.  ?Head is normocephalic, atraumatic.  ?No labored breathing.  ?Speech is clear and coherent with logical content.  ?Patient is alert and oriented at baseline.   ? ? ?Assessment and Plan: ?Anxiety and depression ?Ongoing ?Stop zoloft 25 mg due to jitteriness ?Wait two weeks and start celexa 10 mg daily ?Follow-up in 1 month, sooner if concerns ?Consider trialing ADHD medications as her self assessment was positive ?I discussed with patient that if they develop any SI, to tell someone immediately and seek medical attention. ? ?Insomnia, unspecified type ?Improved ?Continue trazodone 100 mg daily ?Follow-up in 6 months, sooner if concerns ? ?Follow Up Instructions: ?I discussed the assessment and treatment plan with the patient. The patient was provided an opportunity to ask questions and all were answered. The patient agreed with the plan and demonstrated an understanding of the instructions.  A copy of instructions were sent to the patient via MyChart unless otherwise noted below.  ? ?The patient was advised to call back or seek an in-person evaluation if the symptoms worsen or if the condition fails to improve as anticipated. ? ?[name], PA ?

## 2022-05-10 ENCOUNTER — Encounter: Payer: Self-pay | Admitting: Physician Assistant

## 2022-05-21 IMAGING — DX DG KNEE AP/LAT W/ SUNRISE*L*
3 series · 3 of 3 positions shown · non-contrast
Comparison: None.

CLINICAL DATA: Bilateral knee pain

EXAM:
RIGHT KNEE 3 VIEWS; LEFT KNEE 3 VIEWS

[knee ap]
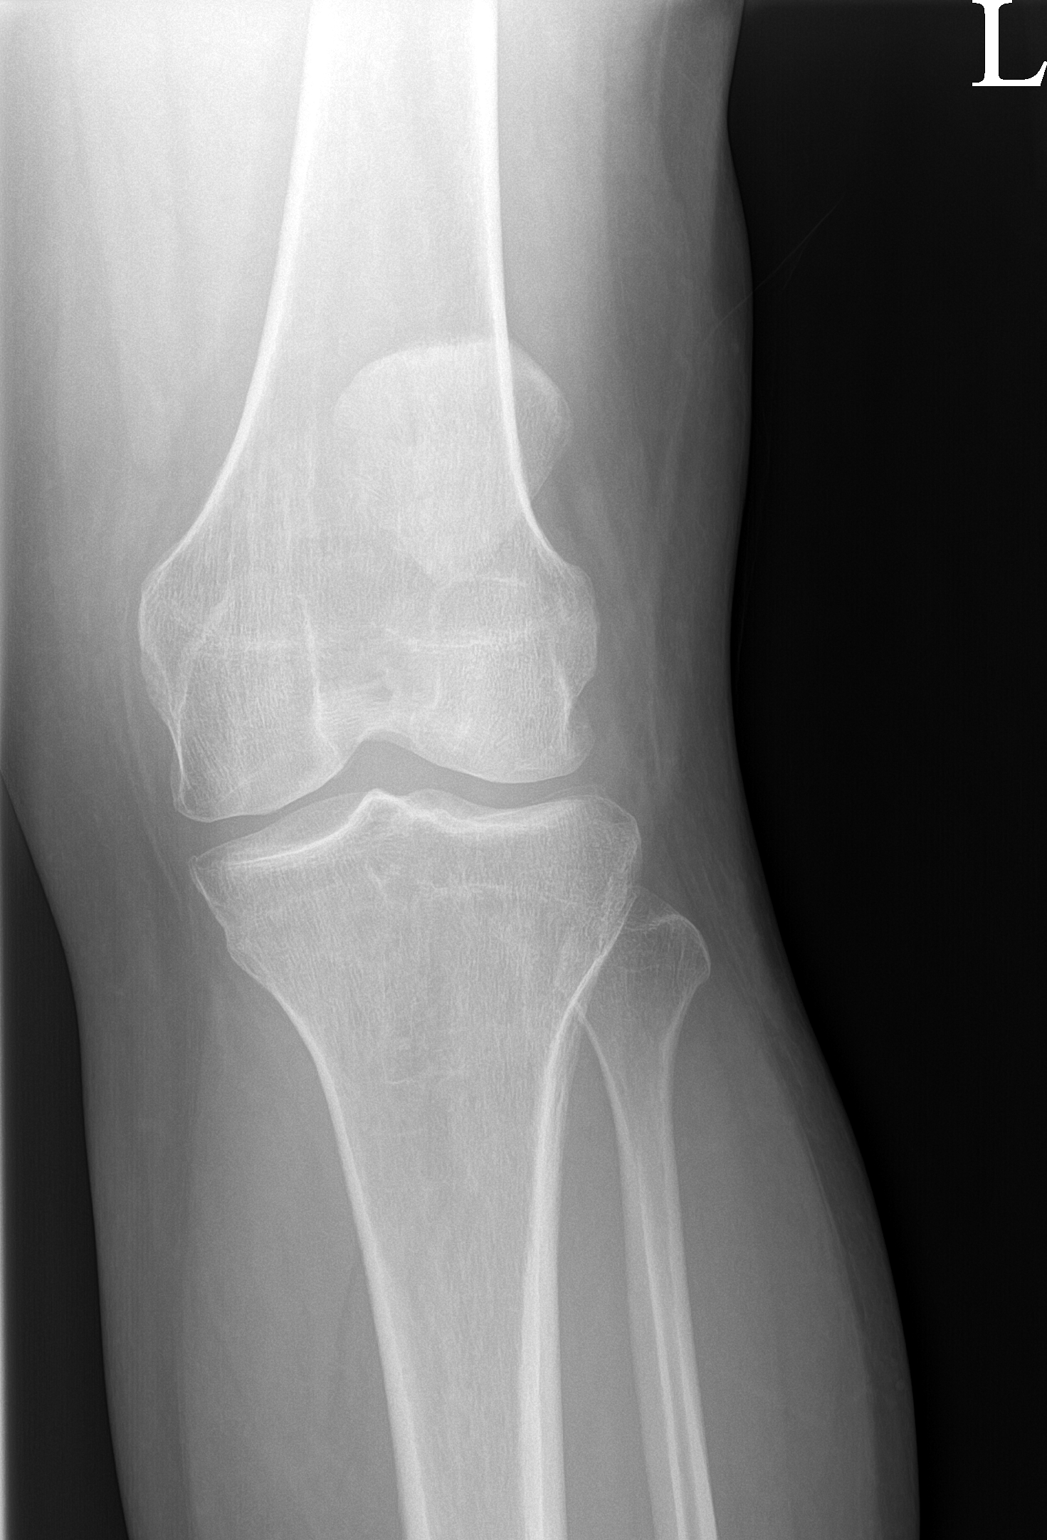

[knee lat]
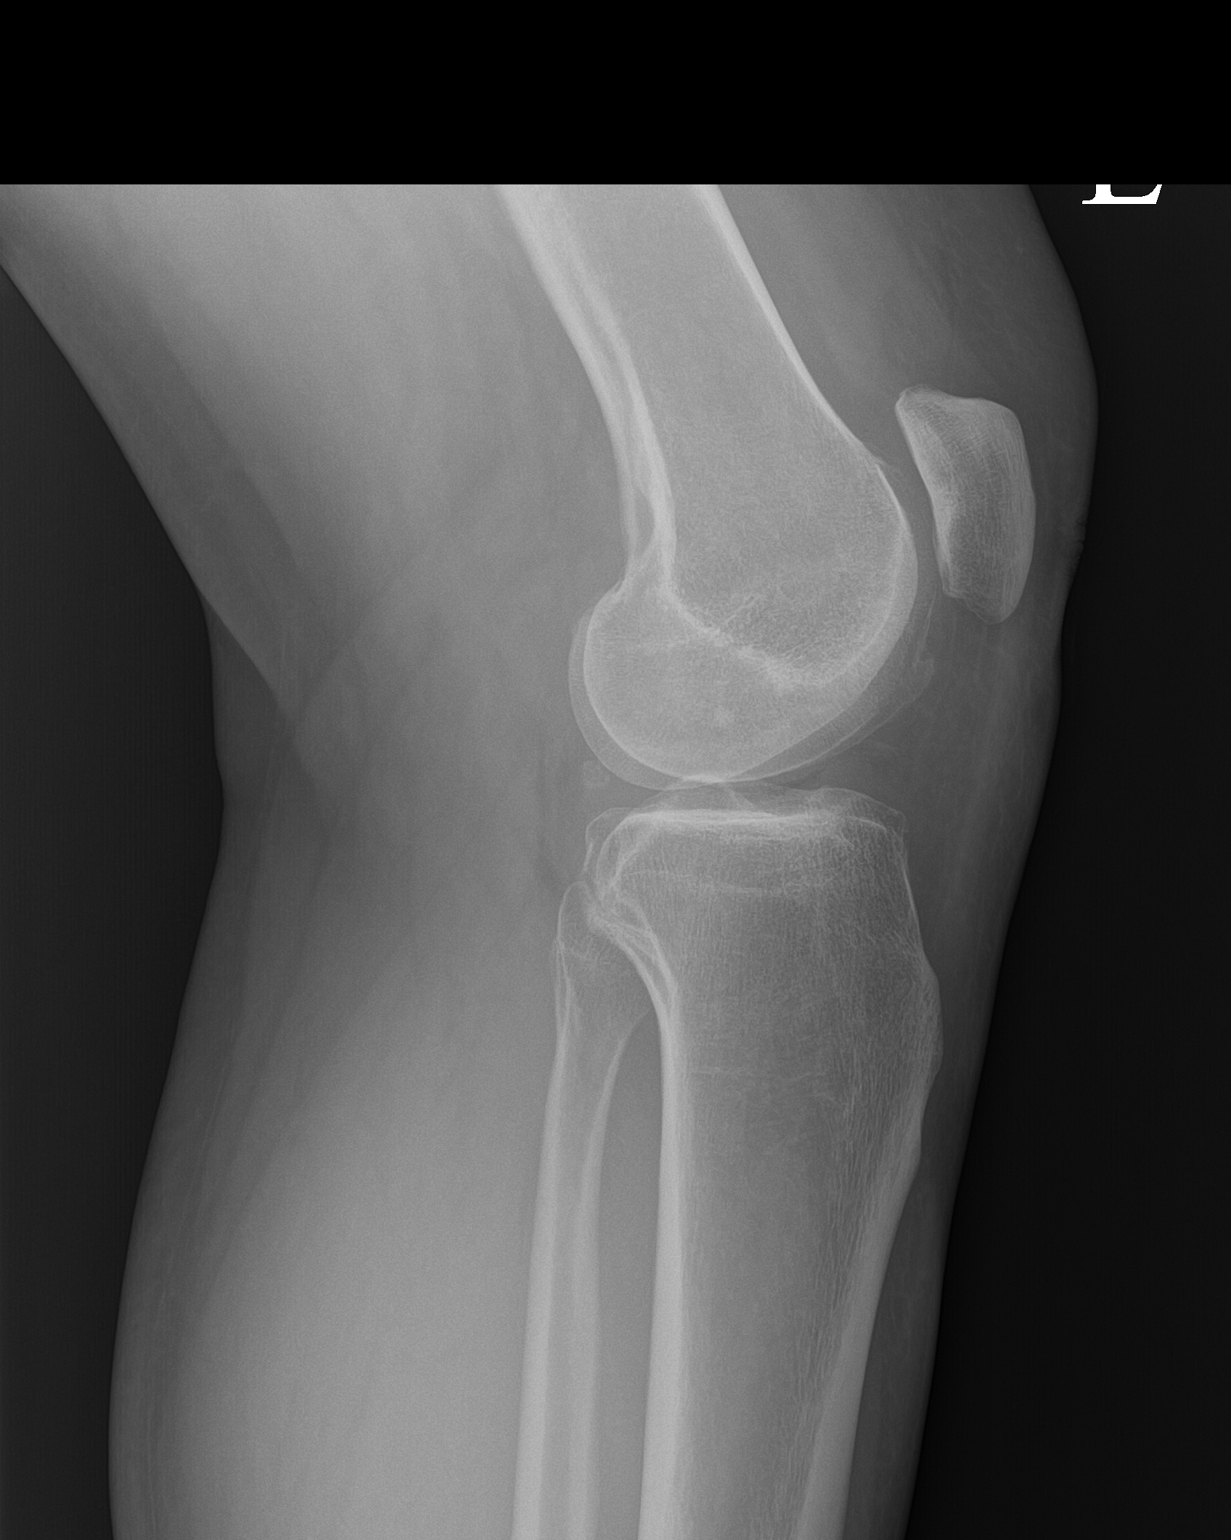

[patella]
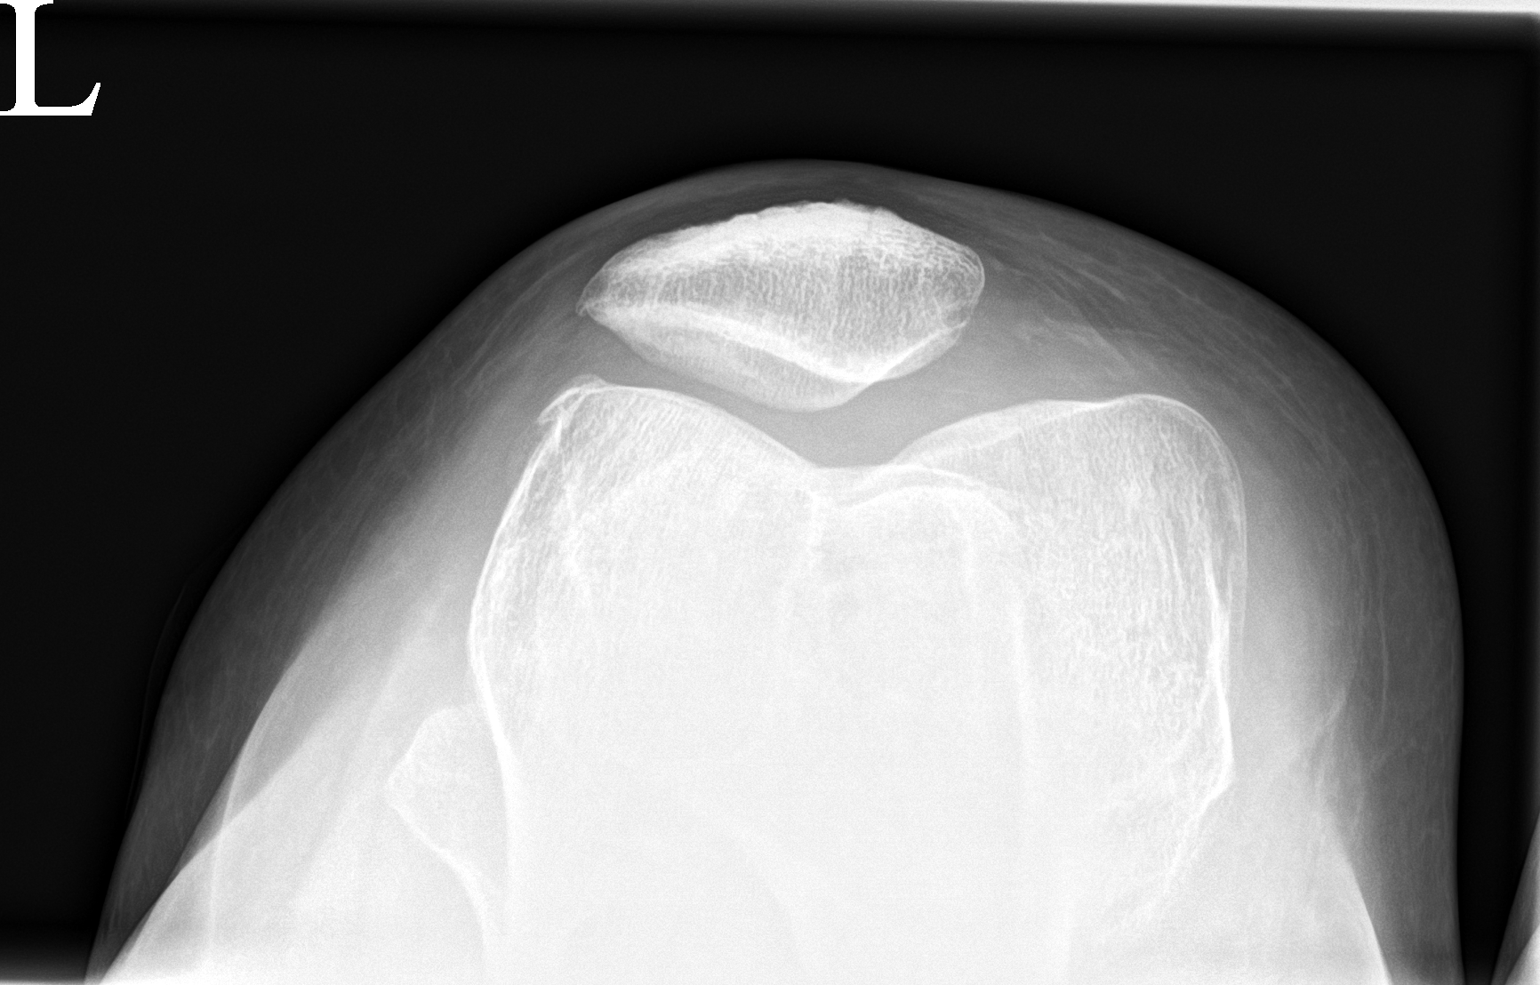

[3 of 3 positions shown; findings below may reference images not displayed]

FINDINGS: Right knee: No evidence of fracture or dislocation. Small joint
effusion. No evidence of arthropathy or other focal bone
abnormality. Soft tissues are unremarkable.

Left knee: No evidence of fracture or dislocation. Small joint
effusion. No evidence of arthropathy or other focal bone
abnormality. Soft tissues are unremarkable.
IMPRESSION: No acute osseous abnormality of the bilateral knees.

Small bilateral joint effusions.

## 2022-05-24 MED ORDER — FLUOXETINE HCL 20 MG PO CAPS: 20.0000 mg | ORAL_CAPSULE | Freq: Every day | ORAL | 0 refills | Status: DC

## 2022-07-14 ENCOUNTER — Ambulatory Visit (INDEPENDENT_AMBULATORY_CARE_PROVIDER_SITE_OTHER): Payer: BC Managed Care – PPO | Admitting: Physician Assistant

## 2022-07-14 ENCOUNTER — Encounter: Payer: Self-pay | Admitting: Physician Assistant

## 2022-07-14 VITALS — BP 120/82 | HR 81 | Temp 97.3°F | Ht 70.0 in | Wt 206.4 lb

## 2022-07-14 DIAGNOSIS — F419 Anxiety disorder, unspecified: Secondary | ICD-10-CM

## 2022-07-14 DIAGNOSIS — H9202 Otalgia, left ear: Secondary | ICD-10-CM | POA: Diagnosis not present

## 2022-07-14 DIAGNOSIS — F32A Depression, unspecified: Secondary | ICD-10-CM

## 2022-07-14 MED ORDER — AMOXICILLIN 875 MG PO TABS: 875.0000 mg | ORAL_TABLET | Freq: Two times a day (BID) | ORAL | 0 refills | Status: AC

## 2022-07-14 NOTE — Progress Notes (Signed)
[name] is a 46 y.o. female here for a follow up on ear pain.   History of Present Illness:   Chief Complaint  Patient presents with   Ear Pain    Pt c/o left ear pain x1 week.    HPI  Ear Pain Patient complain of left ear pain for the past 1 week. She described this as "shooting pain" which does not seem to be improving or worsening. States she never had anything like this. She has tried taking Aleve for this issue. No other specific treatment tried. No cough. No fever or chills. No sore throat. Denies nasal congestion. Denies any other aggravating or relieving factors, no other questions or concerns.   Anxiety and depression Currently taking prozac 20 mg daily. Tolerating well. Denies SI/HI. Doing better with this than the celexa but still uncertain if she loves this medication.    Past Medical History:  Diagnosis Date   Anxiety    Chicken pox    Depression 05/14/2018   Frequent headaches    3-4 days per week; years   GERD (gastroesophageal reflux disease)    diet dependent   Hyperlipidemia    Miscarriage 2005   Vaginal delivery    2006, 2008     Social History   Tobacco Use   Smoking status: Every Day    Packs/day: 1.00    Years: 30.00    Total pack years: 30.00    Types: Cigarettes   Smokeless tobacco: Never  Vaping Use   Vaping Use: Never used  Substance Use Topics   Alcohol use: Yes    Alcohol/week: 5.0 standard drinks of alcohol    Types: 2 Glasses of wine, 3 Standard drinks or equivalent per week   Drug use: No    Past Surgical History:  Procedure Laterality Date   APPENDECTOMY     CESAREAN SECTION N/A 05/13/2018   Procedure: CESAREAN SECTION;  Surgeon: Huel Cote, MD;  Location: [facility] BIRTHING SUITES;  Service: Obstetrics;  Laterality: N/A;   TONSILLECTOMY      Family History  Problem Relation Age of Onset   Arthritis Mother    Alcohol abuse Mother    Hypertension Mother    Mental illness Mother    Anxiety disorder Mother     Depression Mother    Osteoporosis Mother    Alcohol abuse Father    Arthritis Maternal Grandmother    Breast cancer Maternal Grandmother    Diabetes Maternal Grandfather    Heart disease Maternal Grandfather    Diabetes Paternal Grandmother    Colon cancer Neg Hx     No Known Allergies  Current Medications:   Current Outpatient Medications:    FLUoxetine (PROZAC) 20 MG capsule, Take 1 capsule (20 mg total) by mouth daily., Disp: 90 capsule, Rfl: 0   Multiple Vitamin (MULTIVITAMIN) tablet, Take 2 tablets by mouth daily. [employer] Women's Ultra Mega, Disp: , Rfl:    traZODone (DESYREL) 100 MG tablet, Take 1 tablet (100 mg total) by mouth at bedtime., Disp: 90 tablet, Rfl: 1   citalopram (CELEXA) 10 MG tablet, Take 1 tablet (10 mg total) by mouth daily. (Patient not taking: Reported on 07/14/2022), Disp: 30 tablet, Rfl: 1   Review of Systems:   ROS Negative unless otherwise specified per HPI.   Vitals:   Vitals:   07/14/22 0908  BP: 120/82  Pulse: 81  Temp: (!) 97.3 F (36.3 C)  SpO2: 98%  Weight: 206 lb 6.4 oz (93.6 kg)  Height: 5'  10" (1.778 m)     Body mass index is 29.62 kg/m.  Physical Exam:   Physical Exam Vitals and nursing note reviewed.  Constitutional:      General: She is not in acute distress.    Appearance: She is well-developed. She is not ill-appearing or toxic-appearing.  HENT:     Head: Normocephalic and atraumatic.     Right Ear: Tympanic membrane, ear canal and external ear normal. Tympanic membrane is not erythematous, retracted or bulging.     Left Ear: Ear canal and external ear normal. Tympanic membrane is erythematous. Tympanic membrane is not retracted or bulging.     Nose: Nose normal.     Right Sinus: No maxillary sinus tenderness or frontal sinus tenderness.     Left Sinus: No maxillary sinus tenderness or frontal sinus tenderness.     Mouth/Throat:     Pharynx: Uvula midline. No posterior oropharyngeal erythema.  Eyes:     General: Lids are  normal.     Conjunctiva/sclera: Conjunctivae normal.  Neck:     Trachea: Trachea normal.  Cardiovascular:     Rate and Rhythm: Normal rate and regular rhythm.     Pulses: Normal pulses.     Heart sounds: Normal heart sounds, S1 normal and S2 normal.  Pulmonary:     Effort: Pulmonary effort is normal.     Breath sounds: Normal breath sounds. No decreased breath sounds, wheezing, rhonchi or rales.  Lymphadenopathy:     Cervical: No cervical adenopathy.  Skin:    General: Skin is warm and dry.  Neurological:     Mental Status: She is alert.     GCS: GCS eye subscore is 4. GCS verbal subscore is 5. GCS motor subscore is 6.  Psychiatric:        Speech: Speech normal.        Behavior: Behavior normal. Behavior is cooperative.     Assessment and Plan:   Left ear pain Acute otitis media No red flags Start amoxicillin 875 mg BID x 10 days Follow-up if any new/worsening NSAIDs for pain  Anxiety and depression Stable per patient Continue prozac 20 mg daily Follow-up in 3-6 months, sooner if concerns  I,Savera Zaman,acting as a scribe for [employer], PA.,have documented all relevant documentation on the behalf of [name], PA,as directed by  [name], PA while in the presence of [name], [place].   I, [name], [place], have reviewed all documentation for this visit. The documentation on 07/14/22 for the exam, diagnosis, procedures, and orders are all accurate and complete.   [name], PA-C

## 2022-07-18 ENCOUNTER — Ambulatory Visit: Payer: BC Managed Care – PPO | Admitting: Physician Assistant

## 2022-09-01 ENCOUNTER — Other Ambulatory Visit: Payer: Self-pay | Admitting: *Deleted

## 2022-09-01 MED ORDER — FLUOXETINE HCL 20 MG PO CAPS: 20.0000 mg | ORAL_CAPSULE | Freq: Every day | ORAL | 0 refills | Status: DC

## 2022-09-04 ENCOUNTER — Encounter: Payer: Self-pay | Admitting: *Deleted

## 2022-09-14 ENCOUNTER — Ambulatory Visit: Payer: BC Managed Care – PPO | Admitting: Physician Assistant

## 2022-11-23 ENCOUNTER — Encounter: Payer: Self-pay | Admitting: *Deleted

## 2022-12-16 ENCOUNTER — Other Ambulatory Visit: Payer: Self-pay | Admitting: Physician Assistant

## 2023-03-02 ENCOUNTER — Telehealth (INDEPENDENT_AMBULATORY_CARE_PROVIDER_SITE_OTHER): Payer: Medicaid Other | Admitting: Physician Assistant

## 2023-03-02 ENCOUNTER — Encounter: Payer: Self-pay | Admitting: Physician Assistant

## 2023-03-02 VITALS — Ht 70.0 in | Wt 210.0 lb

## 2023-03-02 DIAGNOSIS — F902 Attention-deficit hyperactivity disorder, combined type: Secondary | ICD-10-CM | POA: Diagnosis not present

## 2023-03-02 MED ORDER — AMPHETAMINE-DEXTROAMPHET ER 5 MG PO CP24: 5.0000 mg | ORAL_CAPSULE | Freq: Every day | ORAL | 0 refills | Status: DC

## 2023-03-02 NOTE — Progress Notes (Signed)
   Virtual Visit via Video Note   I, [name], connected with  [name]  ([id number], 1976-03-31) on 03/02/23 at 11:20 AM EDT by a video-enabled telemedicine application and verified that I am speaking with the correct person using two identifiers.  Location: Patient: Home Provider: [place] office   I discussed the limitations of evaluation and management by telemedicine and the availability of in person appointments. The patient expressed understanding and agreed to proceed.    History of Present Illness: [name] is a 47 y.o. who identifies as a female who was assigned female at birth, and is being seen today for ADHD. Pt would like to discuss ADHD medication.   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 [employer] Women's Ultra Mega, Disp: , Rfl:    traZODone (DESYREL) 100 MG tablet, Take 1 tablet (100 mg total) by mouth at bedtime., Disp: 90 tablet, Rfl: 1  Observations/Objective: Patient is well-developed, well-nourished in no acute distress.  Resting comfortably  at home.  Head is normocephalic, atraumatic.  No labored breathing.  Speech is clear and coherent with logical  content.  Patient is alert and oriented at baseline.    Assessment and Plan: 1. Attention deficit hyperactivity disorder (ADHD), combined type Uncontrolled Start Adderall XR 5 mg daily, may increase up to 10 mg daily if needed -- recommend send mychart message in a week or two to check in on this medication, sooner if concerns  Follow Up Instructions: I discussed the assessment and treatment plan with the patient. The patient was provided an opportunity to ask questions and all were answered. The patient agreed with the plan and demonstrated an understanding of the instructions.  A copy of instructions were sent to the patient via MyChart unless otherwise noted below.   The patient was advised to call back or seek an in-person evaluation if the symptoms worsen or if the condition fails to improve as anticipated.  [name], [place]

## 2023-03-05 ENCOUNTER — Encounter: Payer: Self-pay | Admitting: Physician Assistant

## 2023-03-20 ENCOUNTER — Encounter: Payer: Self-pay | Admitting: Physician Assistant

## 2023-03-24 ENCOUNTER — Encounter: Payer: Self-pay | Admitting: Physician Assistant

## 2023-03-27 ENCOUNTER — Other Ambulatory Visit: Payer: Self-pay | Admitting: Physician Assistant

## 2023-03-27 MED ORDER — AMPHETAMINE-DEXTROAMPHET ER 15 MG PO CP24: 15.0000 mg | ORAL_CAPSULE | ORAL | 0 refills | Status: DC

## 2023-04-02 ENCOUNTER — Other Ambulatory Visit: Payer: Self-pay | Admitting: Physician Assistant

## 2023-04-28 ENCOUNTER — Other Ambulatory Visit: Payer: Self-pay | Admitting: Physician Assistant

## 2023-04-30 MED ORDER — AMPHETAMINE-DEXTROAMPHET ER 15 MG PO CP24: 15.0000 mg | ORAL_CAPSULE | ORAL | 0 refills | Status: DC

## 2023-04-30 NOTE — Telephone Encounter (Signed)
Last refill: 03/27/23 #30, 0 Last OV: 03/02/23 dx. ADHD

## 2023-06-06 ENCOUNTER — Other Ambulatory Visit: Payer: Self-pay | Admitting: Physician Assistant

## 2023-06-20 ENCOUNTER — Other Ambulatory Visit: Payer: Self-pay | Admitting: Physician Assistant

## 2023-06-20 MED ORDER — AMPHETAMINE-DEXTROAMPHET ER 15 MG PO CP24: 15.0000 mg | ORAL_CAPSULE | ORAL | 0 refills | Status: DC

## 2023-06-20 NOTE — Telephone Encounter (Signed)
Pt requesting refill for Adderall XR 15 mg. Last OV 03/02/2023.

## 2023-08-26 ENCOUNTER — Other Ambulatory Visit: Payer: Self-pay | Admitting: Physician Assistant

## 2023-08-28 MED ORDER — AMPHETAMINE-DEXTROAMPHET ER 15 MG PO CP24: 15.0000 mg | ORAL_CAPSULE | ORAL | 0 refills | Status: DC

## 2023-09-20 ENCOUNTER — Ambulatory Visit: Payer: Medicaid Other | Admitting: Physician Assistant

## 2023-10-02 DIAGNOSIS — N899 Noninflammatory disorder of vagina, unspecified: Secondary | ICD-10-CM | POA: Diagnosis not present

## 2023-10-02 DIAGNOSIS — Z113 Encounter for screening for infections with a predominantly sexual mode of transmission: Secondary | ICD-10-CM | POA: Diagnosis not present

## 2023-10-02 DIAGNOSIS — N898 Other specified noninflammatory disorders of vagina: Secondary | ICD-10-CM | POA: Diagnosis not present

## 2023-10-22 NOTE — Progress Notes (Signed)
[name] is a 47 y.o. female and is here for a comprehensive physical exam.  HPI Health Maintenance Due  Topic Date Due   Hepatitis C Screening  Never done   MAMMOGRAM  07/19/2022   Colonoscopy  Never done   COVID-19 Vaccine (4 - 2023-24 season) 08/12/2023   No chief complaint on file.  Acute Concerns: {ExamConcerns:31114}  Chronic Issues: {ExamConcerns:31114}  Health Maintenance: Immunizations -- *** Colonoscopy-- N/A {Mammogram:31278} PAP-- Last done 07/19/20. Results were normal. Repeat 2026. Bone Density-- N/A Diet -- {CPE Diet/Exercise:30649} Exercise -- {CPE Diet/Exercise:30649}  Sleep habits -- {CPE Mood/Sleep:30650} Mood -- {CPE Mood/Sleep:30650}  UTD with dentist? - {Eye Doctor/Dentist:30651} UTD with eye doctor? - {Eye Doctor/Dentist:30651}  Weight history: Wt Readings from Last 10 Encounters:  03/02/23 210 lb (95.3 kg)  07/14/22 206 lb 6.4 oz (93.6 kg)  03/02/22 207 lb (93.9 kg)  09/29/21 212 lb 3.2 oz (96.3 kg)  09/12/21 216 lb 6.4 oz (98.2 kg)  09/05/21 214 lb 8 oz (97.3 kg)  05/12/18 215 lb (97.5 kg)  01/08/17 222 lb 4 oz (100.8 kg)   There is no height or weight on file to calculate BMI. No LMP recorded. (Menstrual status: Irregular Periods).  Alcohol use:  reports current alcohol use of about 5.0 standard drinks of alcohol per week.  Tobacco use:  Tobacco Use: High Risk (03/02/2023)   Patient History    Smoking Tobacco Use: Every Day    Smokeless Tobacco Use: Never    Passive Exposure: Not on file   Eligible for lung cancer screening? ***     03/02/2023   11:26 AM  Depression screen PHQ 2/9  Decreased Interest 0  Down, Depressed, Hopeless 0  PHQ - 2 Score 0  Altered sleeping 0  Tired, decreased energy 3  Change in appetite 0  Feeling bad or failure about yourself  1  Trouble concentrating 0  Moving slowly or fidgety/restless 1  Suicidal thoughts 0  PHQ-9 Score 5  Difficult doing work/chores Very difficult     Other  providers/specialists: Patient Care Team: [name], [place] as PCP - [facility], [facility] as Consulting Physician (Gynecology) Center, [facility] as Consulting Physician (Ophthalmology)   PMHx, SurgHx, SocialHx, Medications, and Allergies were reviewed in the Visit Navigator and updated as appropriate.   Past Medical History:  Diagnosis Date   Anxiety    Chicken pox    Depression 05/14/2018   Frequent headaches    3-4 days per week; years   GERD (gastroesophageal reflux disease)    diet dependent   Hyperlipidemia    Miscarriage 2005   Vaginal delivery    2006, 2008    Past Surgical History:  Procedure Laterality Date   APPENDECTOMY     CESAREAN SECTION N/A 05/13/2018   Procedure: CESAREAN SECTION;  Surgeon: Huel Cote, MD;  Location: [facility] BIRTHING SUITES;  Service: Obstetrics;  Laterality: N/A;   TONSILLECTOMY     Family History  Problem Relation Age of Onset   Arthritis Mother    Alcohol abuse Mother    Hypertension Mother    Mental illness Mother    Anxiety disorder Mother    Depression Mother    Osteoporosis Mother    Alcohol abuse Father    Arthritis Maternal Grandmother    Breast cancer Maternal Grandmother    Diabetes Maternal Grandfather    Heart disease Maternal Grandfather    Diabetes Paternal Grandmother    Colon cancer Neg Hx    Social History  Tobacco Use   Smoking status: Every Day    Current packs/day: 1.00    Average packs/day: 1 pack/day for 30.0 years (30.0 ttl pk-yrs)    Types: Cigarettes   Smokeless tobacco: Never  Vaping Use   Vaping status: Never Used  Substance Use Topics   Alcohol use: Yes    Alcohol/week: 5.0 standard drinks of alcohol    Types: 2 Glasses of wine, 3 Standard drinks or equivalent per week   Drug use: No   Review of Systems:   ROS See pertinent positives and negatives as per the HPI.  Objective:   There were no vitals taken for this visit. There is no height  or weight on file to calculate BMI.   General Appearance:    Alert, cooperative, no distress, appears stated age  Head:    Normocephalic, without obvious abnormality, atraumatic  Eyes:    PERRL, conjunctiva/corneas clear, EOM's intact, fundi    benign, both eyes  Ears:    Normal TM's and external ear canals, both ears  Nose:   Nares normal, septum midline, mucosa normal, no drainage    or sinus tenderness  Throat:   Lips, mucosa, and tongue normal; teeth and gums normal  Neck:   Supple, symmetrical, trachea midline, no adenopathy;    thyroid:  no enlargement/tenderness/nodules; no carotid   bruit or JVD  Back:     Symmetric, no curvature, ROM normal, no CVA tenderness  Lungs:     Clear to auscultation bilaterally, respirations unlabored  Chest Wall:    No tenderness or deformity   Heart:    Regular rate and rhythm, S1 and S2 normal, no murmur, rub or gallop  Breast Exam:    ***No tenderness, masses, or nipple abnormality  Abdomen:     Soft, non-tender, bowel sounds active all four quadrants,    no masses, no organomegaly  Genitalia:    ***Normal female without lesion, discharge or tenderness  Extremities:   Extremities normal, atraumatic, no cyanosis or edema  Pulses:   2+ and symmetric all extremities  Skin:   Skin color, texture, turgor normal, no rashes or lesions  Lymph nodes:   Cervical, supraclavicular, and axillary nodes normal  Neurologic:   CNII-XII intact, normal strength, sensation and reflexes    throughout    Assessment/Plan:   There are no diagnoses linked to this encounter.          I,Emily Lagle,acting as a Neurosurgeon for [employer], PA.,have documented all relevant documentation on the behalf of [name], PA,as directed by  [name], PA while in the presence of [name], [place].  *** (refresh reminder)  I, [name], PA, have reviewed all documentation for this visit. The documentation on 10/22/23 for the exam, diagnosis,  procedures, and orders are all accurate and complete.  [name], PA-C [facility]

## 2023-10-23 ENCOUNTER — Ambulatory Visit (INDEPENDENT_AMBULATORY_CARE_PROVIDER_SITE_OTHER): Payer: Medicaid Other | Admitting: Physician Assistant

## 2023-10-23 ENCOUNTER — Encounter: Payer: Self-pay | Admitting: Physician Assistant

## 2023-10-23 VITALS — BP 142/90 | HR 76 | Temp 98.0°F | Ht 70.0 in | Wt 210.4 lb

## 2023-10-23 DIAGNOSIS — F902 Attention-deficit hyperactivity disorder, combined type: Secondary | ICD-10-CM

## 2023-10-23 DIAGNOSIS — Z1211 Encounter for screening for malignant neoplasm of colon: Secondary | ICD-10-CM

## 2023-10-23 DIAGNOSIS — F32A Depression, unspecified: Secondary | ICD-10-CM

## 2023-10-23 DIAGNOSIS — Z6830 Body mass index (BMI) 30.0-30.9, adult: Secondary | ICD-10-CM | POA: Diagnosis not present

## 2023-10-23 DIAGNOSIS — G4700 Insomnia, unspecified: Secondary | ICD-10-CM | POA: Diagnosis not present

## 2023-10-23 DIAGNOSIS — Z0000 Encounter for general adult medical examination without abnormal findings: Secondary | ICD-10-CM | POA: Diagnosis not present

## 2023-10-23 DIAGNOSIS — Z13220 Encounter for screening for lipoid disorders: Secondary | ICD-10-CM | POA: Diagnosis not present

## 2023-10-23 DIAGNOSIS — F419 Anxiety disorder, unspecified: Secondary | ICD-10-CM

## 2023-10-23 DIAGNOSIS — Z720 Tobacco use: Secondary | ICD-10-CM

## 2023-10-23 DIAGNOSIS — Z23 Encounter for immunization: Secondary | ICD-10-CM | POA: Diagnosis not present

## 2023-10-23 DIAGNOSIS — E669 Obesity, unspecified: Secondary | ICD-10-CM | POA: Diagnosis not present

## 2023-10-23 DIAGNOSIS — Z1231 Encounter for screening mammogram for malignant neoplasm of breast: Secondary | ICD-10-CM

## 2023-10-23 MED ORDER — AMPHETAMINE-DEXTROAMPHET ER 15 MG PO CP24: 15.0000 mg | ORAL_CAPSULE | ORAL | 0 refills | Status: DC

## 2023-10-23 MED ORDER — AMPHETAMINE-DEXTROAMPHETAMINE 10 MG PO TABS
ORAL_TABLET | ORAL | 0 refills | Status: DC
Start: 2023-10-23 — End: 2023-12-04

## 2023-10-23 NOTE — Patient Instructions (Addendum)
It was great to see you!  Work on better self care as we discussed! Consider 5-10 mg Adderall around early afternoon to see if that can carry you through on your late days. Message me with concerns.  Please go to the lab for blood work.   Our office will call you with your results unless you have chosen to receive results via MyChart.  If your blood work is normal we will follow-up each year for physicals and as scheduled for chronic medical problems.  If anything is abnormal we will treat accordingly and get you in for a follow-up.  Take care,  Lelon Mast

## 2023-10-24 LAB — COMPREHENSIVE METABOLIC PANEL
ALT: 13 U/L (ref 0–35)
AST: 23 U/L (ref 0–37)
Albumin: 4.1 g/dL (ref 3.5–5.2)
Alkaline Phosphatase: 60 U/L (ref 39–117)
BUN: 8 mg/dL (ref 6–23)
CO2: 27 meq/L (ref 19–32)
Calcium: 9.1 mg/dL (ref 8.4–10.5)
Chloride: 103 meq/L (ref 96–112)
Creatinine, Ser: 0.84 mg/dL (ref 0.40–1.20)
GFR: 84.03 mL/min (ref >60.00)
Glucose, Bld: 90 mg/dL (ref 70–99)
Potassium: 3.8 meq/L (ref 3.5–5.1)
Sodium: 136 meq/L (ref 135–145)
Total Bilirubin: 0.4 mg/dL (ref 0.2–1.2)
Total Protein: 7 g/dL (ref 6.0–8.3)

## 2023-10-24 LAB — LIPID PANEL
Cholesterol: 215 mg/dL — ABNORMAL HIGH (ref 0–200)
HDL: 44.4 mg/dL (ref >39.00)
LDL Cholesterol: 102 mg/dL — ABNORMAL HIGH (ref 0–99)
NonHDL: 170.23
Total CHOL/HDL Ratio: 5
Triglycerides: 340 mg/dL — ABNORMAL HIGH (ref 0.0–149.0)
VLDL: 68 mg/dL — ABNORMAL HIGH (ref 0.0–40.0)

## 2023-10-24 LAB — CBC WITH DIFFERENTIAL/PLATELET
Basophils Absolute: 0.1 10*3/uL (ref 0.0–0.1)
Basophils Relative: 1.3 % (ref 0.0–3.0)
Eosinophils Absolute: 0.1 10*3/uL (ref 0.0–0.7)
Eosinophils Relative: 1.7 % (ref 0.0–5.0)
HCT: 42.8 % (ref 36.0–46.0)
Hemoglobin: 14 g/dL (ref 12.0–15.0)
Lymphocytes Relative: 49.1 % — ABNORMAL HIGH (ref 12.0–46.0)
Lymphs Abs: 2.6 10*3/uL (ref 0.7–4.0)
MCHC: 32.7 g/dL (ref 30.0–36.0)
MCV: 95.1 fL (ref 78.0–100.0)
Monocytes Absolute: 0.4 10*3/uL (ref 0.1–1.0)
Monocytes Relative: 7.4 % (ref 3.0–12.0)
Neutro Abs: 2.1 10*3/uL (ref 1.4–7.7)
Neutrophils Relative %: 40.5 % — ABNORMAL LOW (ref 43.0–77.0)
Platelets: 244 10*3/uL (ref 150.0–400.0)
RBC: 4.5 Mil/uL (ref 3.87–5.11)
RDW: 14.5 % (ref 11.5–15.5)
WBC: 5.3 10*3/uL (ref 4.0–10.5)

## 2023-11-14 ENCOUNTER — Encounter: Payer: Self-pay | Admitting: Internal Medicine

## 2023-11-27 ENCOUNTER — Ambulatory Visit: Payer: Medicaid Other

## 2023-11-29 ENCOUNTER — Ambulatory Visit
Admission: RE | Admit: 2023-11-29 | Discharge: 2023-11-29 | Disposition: A | Discharge status: Home / Self Care | Payer: Medicaid Other | Source: Ambulatory Visit | Attending: Physician Assistant | Admitting: Physician Assistant

## 2023-11-29 DIAGNOSIS — Z1231 Encounter for screening mammogram for malignant neoplasm of breast: Secondary | ICD-10-CM

## 2023-12-04 ENCOUNTER — Other Ambulatory Visit: Payer: Self-pay | Admitting: Physician Assistant

## 2023-12-04 MED ORDER — AMPHETAMINE-DEXTROAMPHETAMINE 10 MG PO TABS: ORAL_TABLET | ORAL | 0 refills | Status: DC

## 2023-12-19 ENCOUNTER — Ambulatory Visit: Payer: Medicaid Other

## 2023-12-19 VITALS — Ht 70.0 in | Wt 200.0 lb

## 2023-12-19 DIAGNOSIS — Z1211 Encounter for screening for malignant neoplasm of colon: Secondary | ICD-10-CM

## 2023-12-19 MED ORDER — PLENVU 140 G PO SOLR: 1.0000 | Freq: Once | ORAL | 0 refills | Status: AC

## 2023-12-19 NOTE — Progress Notes (Signed)
No egg or soy allergy known to patient  No issues known to pt with past sedation with any surgeries or procedures Patient denies ever being told they had issues or difficulty with intubation  No FH of Malignant Hyperthermia Pt is not on diet pills Pt is not on  home 02  Pt is not on blood thinners  Pt denies issues with constipation  No A fib or A flutter Have any cardiac testing pending--no  LOA: independent  Prep: plenvu   Patient's chart reviewed by Cathlyn Parsons CNRA prior to previsit and patient appropriate for the [facility].  Previsit completed and red dot placed by patient's name on their procedure day (on provider's schedule).     PV competed with patient. Prep instructions sent via mychart and home address.

## 2024-01-09 ENCOUNTER — Encounter: Payer: Medicaid Other | Admitting: Internal Medicine

## 2024-01-09 ENCOUNTER — Telehealth: Payer: Self-pay | Admitting: Internal Medicine

## 2024-01-09 MED ORDER — PLENVU 140 G PO SOLR: 1.0000 | ORAL | 0 refills | Status: DC

## 2024-01-09 NOTE — Telephone Encounter (Signed)
Noted

## 2024-01-09 NOTE — Telephone Encounter (Signed)
Called the patient back. Will sent new Rx for Plenvu and new prep instructions to her Mychart.

## 2024-01-09 NOTE — Telephone Encounter (Signed)
Inbound call from patient requesting to cancel this mornings colonoscopy due to child being sick. Patient is rescheduled for 3/10 and would need new prep medication. Please advise, thank you.

## 2024-02-18 ENCOUNTER — Ambulatory Visit (AMBULATORY_SURGERY_CENTER): Payer: Medicaid Other | Admitting: Internal Medicine

## 2024-02-18 ENCOUNTER — Encounter: Payer: Self-pay | Admitting: Internal Medicine

## 2024-02-18 VITALS — BP 142/99 | HR 69 | Temp 98.1°F | Resp 22 | Ht 70.0 in | Wt 200.0 lb

## 2024-02-18 DIAGNOSIS — K389 Disease of appendix, unspecified: Secondary | ICD-10-CM

## 2024-02-18 DIAGNOSIS — D124 Benign neoplasm of descending colon: Secondary | ICD-10-CM | POA: Diagnosis not present

## 2024-02-18 DIAGNOSIS — K635 Polyp of colon: Secondary | ICD-10-CM | POA: Diagnosis not present

## 2024-02-18 DIAGNOSIS — Z1211 Encounter for screening for malignant neoplasm of colon: Secondary | ICD-10-CM | POA: Diagnosis not present

## 2024-02-18 DIAGNOSIS — F419 Anxiety disorder, unspecified: Secondary | ICD-10-CM | POA: Diagnosis not present

## 2024-02-18 DIAGNOSIS — K648 Other hemorrhoids: Secondary | ICD-10-CM

## 2024-02-18 DIAGNOSIS — D125 Benign neoplasm of sigmoid colon: Secondary | ICD-10-CM

## 2024-02-18 DIAGNOSIS — F32A Depression, unspecified: Secondary | ICD-10-CM | POA: Diagnosis not present

## 2024-02-18 MED ORDER — SODIUM CHLORIDE 0.9 % IV SOLN: 500.0000 mL | Freq: Once | INTRAVENOUS | Status: DC

## 2024-02-18 NOTE — Op Note (Signed)
 [facility] Patient Name: [name] Procedure Date: 02/18/2024 9:54 AM MRN: [id number] Endoscopist: Wilhemina Bonito. Marina Goodell , MD, [id number] Age: 48 Referring MD:  Date of Birth: Apr 22, 1976 Gender: Female Account #: [id number] Procedure:                Colonoscopy cold snare polypectomy x 3 Indications:              Screening for colorectal malignant neoplasm Medicines:                Monitored Anesthesia Care Procedure:                Pre-Anesthesia Assessment:                           - Prior to the procedure, a History and Physical                            was performed, and patient medications and                            allergies were reviewed. The patient's tolerance of                            previous anesthesia was also reviewed. The risks                            and benefits of the procedure and the sedation                            options and risks were discussed with the patient.                            All questions were answered, and informed consent                            was obtained. Prior Anticoagulants: The patient has                            taken no anticoagulant or antiplatelet agents. ASA                            Grade Assessment: II - A patient with mild systemic                            disease. After reviewing the risks and benefits,                            the patient was deemed in satisfactory condition to                            undergo the procedure.                           After obtaining informed consent, the colonoscope  was passed under direct vision. Throughout the                            procedure, the patient's blood pressure, pulse, and                            oxygen saturations were monitored continuously. The                            Olympus Scope SN: [id number] was introduced through                            the anus and advanced to the the cecum, identified                             by appendiceal orifice and ileocecal valve. The                            ileocecal valve, appendiceal orifice, and rectum                            were photographed. The quality of the bowel                            preparation was good. The colonoscopy was performed                            without difficulty. The patient tolerated the                            procedure well. The bowel preparation used was                            SUPREP via split dose instruction. Scope In: 10:08:12 AM Scope Out: 10:24:48 AM Scope Withdrawal Time: 0 hours 13 minutes 9 seconds  Total Procedure Duration: 0 hours 16 minutes 36 seconds  Findings:                 Three polyps were found in the sigmoid colon and                            descending colon. The polyps were 2 to 3 mm in                            size. These polyps were removed with a cold snare.                            Resection and retrieval were complete.                           Internal hemorrhoids were found during                            retroflexion. The appendix was  inverted.                           The exam was otherwise without abnormality on                            direct and retroflexion views. Complications:            No immediate complications. Estimated blood loss:                            None. Estimated Blood Loss:     Estimated blood loss: none. Impression:               - Three 2 to 3 mm polyps in the sigmoid colon and                            in the descending colon, removed with a cold snare.                            Resected and retrieved.                           - Internal hemorrhoids. Inverted appendix.                           - The examination was otherwise normal on direct                            and retroflexion views. Recommendation:           - Repeat colonoscopy in 5- 7?"10 years for                            surveillance, based on final pathology.                            - Patient has a contact number available for                            emergencies. The signs and symptoms of potential                            delayed complications were discussed with the                            patient. Return to normal activities tomorrow.                            Written discharge instructions were provided to the                            patient.                           - Resume previous diet.                           -  Continue present medications.                           - Await pathology results. Wilhemina Bonito. Marina Goodell, MD 02/18/2024 10:30:03 AM This report has been signed electronically.

## 2024-02-18 NOTE — Progress Notes (Signed)
 Called to room to assist during endoscopic procedure.  Patient ID and intended procedure confirmed with present staff. Received instructions for my participation in the procedure from the performing physician.

## 2024-02-18 NOTE — Progress Notes (Signed)
 Pt resting comfortably. VSS. Airway intact. SBAR complete to RN. All questions answered.

## 2024-02-18 NOTE — Progress Notes (Signed)
 HISTORY OF PRESENT ILLNESS:  [name] is a 48 y.o. female sent for screening colonoscopy.  No complaints  REVIEW OF SYSTEMS:  All non-GI ROS negative except for  Past Medical History:  Diagnosis Date   Anxiety    Chicken pox    Depression 05/14/2018   Frequent headaches    3-4 days per week; years   GERD (gastroesophageal reflux disease)    diet dependent   Hyperlipidemia    Miscarriage 2005   Vaginal delivery    2006, 2008    Past Surgical History:  Procedure Laterality Date   APPENDECTOMY     CESAREAN SECTION N/A 05/13/2018   Procedure: CESAREAN SECTION;  Surgeon: Huel Cote, MD;  Location: [facility] BIRTHING SUITES;  Service: Obstetrics;  Laterality: N/A;   TONSILLECTOMY      Social History [name]  reports that she has been smoking cigarettes. She has a 30 pack-year smoking history. She has never used smokeless tobacco. She reports current alcohol use of about 5.0 standard drinks of alcohol per week. She reports that she does not use drugs.  family history includes Alcohol abuse in her father and mother; Anxiety disorder in her mother; Arthritis in her maternal grandmother and mother; Breast cancer (age of onset: 23 - 22) in her maternal grandmother; Depression in her mother; Diabetes in her maternal grandfather and paternal grandmother; Drug abuse in her sister; Early death in her sister; Heart disease in her maternal grandfather; Hypertension in her mother; Mental illness in her mother; Miscarriages / Stillbirths in her mother; Osteoporosis in her mother.  No Known Allergies     PHYSICAL EXAMINATION: Vital signs: BP (!) 156/106   Pulse 75   Temp 98.1 F (36.7 C) (Skin)   Ht 5\' 10"  (1.778 m)   Wt 200 lb (90.7 kg)   SpO2 100%   BMI 28.70 kg/m  General: Well-developed, well-nourished, no acute distress HEENT: Sclerae are anicteric, conjunctiva pink. Oral mucosa intact Lungs: Clear Heart: Regular Abdomen: soft, nontender, nondistended, no obvious  ascites, no peritoneal signs, normal bowel sounds. No organomegaly. Extremities: No edema Psychiatric: alert and oriented x3. Cooperative     ASSESSMENT:  Colon cancer screening   PLAN:   Screening colonoscopy

## 2024-02-18 NOTE — Patient Instructions (Signed)

## 2024-02-18 NOTE — Progress Notes (Signed)
 Pt's states no medical or surgical changes since previsit or office visit.

## 2024-02-19 ENCOUNTER — Telehealth: Payer: Self-pay | Admitting: *Deleted

## 2024-02-19 NOTE — Telephone Encounter (Signed)
  Follow up Call-     02/18/2024    8:18 AM  Call back number  Post procedure Call Back phone  # [phone]  Permission to leave phone message Yes     Patient questions:  Do you have a fever, pain , or abdominal swelling? No. Pain Score  0 *  Have you tolerated food without any problems? Yes.    Have you been able to return to your normal activities? Yes.    Do you have any questions about your discharge instructions: Diet   No. Medications  No. Follow up visit  No.  Do you have questions or concerns about your Care? No.  Actions: * If pain score is 4 or above: No action needed, pain <4.

## 2024-02-20 LAB — SURGICAL PATHOLOGY

## 2024-02-21 ENCOUNTER — Encounter: Payer: Self-pay | Admitting: Internal Medicine

## 2024-02-22 ENCOUNTER — Other Ambulatory Visit: Payer: Self-pay | Admitting: Physician Assistant

## 2024-02-22 MED ORDER — AMPHETAMINE-DEXTROAMPHET ER 15 MG PO CP24: 15.0000 mg | ORAL_CAPSULE | ORAL | 0 refills | Status: DC

## 2024-02-22 NOTE — Telephone Encounter (Signed)
 Pt requesting refill for Adderall XR 15 mg. Last OV 10/22/2024. Pt scheduled 04/30/2024.

## 2024-04-30 ENCOUNTER — Ambulatory Visit: Payer: Medicaid Other | Admitting: Physician Assistant

## 2024-05-14 ENCOUNTER — Ambulatory Visit: Admitting: Physician Assistant

## 2024-05-14 ENCOUNTER — Encounter: Payer: Self-pay | Admitting: Physician Assistant

## 2024-05-14 VITALS — BP 140/90 | HR 84 | Temp 98.1°F | Ht 70.0 in | Wt 233.4 lb

## 2024-05-14 DIAGNOSIS — R030 Elevated blood-pressure reading, without diagnosis of hypertension: Secondary | ICD-10-CM | POA: Diagnosis not present

## 2024-05-14 DIAGNOSIS — F32A Depression, unspecified: Secondary | ICD-10-CM

## 2024-05-14 DIAGNOSIS — E669 Obesity, unspecified: Secondary | ICD-10-CM | POA: Diagnosis not present

## 2024-05-14 DIAGNOSIS — F902 Attention-deficit hyperactivity disorder, combined type: Secondary | ICD-10-CM | POA: Diagnosis not present

## 2024-05-14 DIAGNOSIS — F419 Anxiety disorder, unspecified: Secondary | ICD-10-CM

## 2024-05-14 MED ORDER — AMPHETAMINE-DEXTROAMPHETAMINE 10 MG PO TABS: 10.0000 mg | ORAL_TABLET | Freq: Every day | ORAL | 0 refills | Status: DC | PRN

## 2024-05-14 MED ORDER — AMPHETAMINE-DEXTROAMPHET ER 15 MG PO CP24: 15.0000 mg | ORAL_CAPSULE | ORAL | 0 refills | Status: DC

## 2024-05-14 MED ORDER — WEGOVY 0.25 MG/0.5ML ~~LOC~~ SOAJ: 0.2500 mg | SUBCUTANEOUS | 1 refills | Status: DC

## 2024-05-14 MED ORDER — BUSPIRONE HCL 15 MG PO TABS: 15.0000 mg | ORAL_TABLET | Freq: Three times a day (TID) | ORAL | 1 refills | Status: DC | PRN

## 2024-05-14 NOTE — Progress Notes (Signed)
 [name] is a 48 y.o. female here for a follow up of a pre-existing problem.  History of Present Illness:   Chief Complaint  Patient presents with   ADHD   Anxiety/Depression    Pt says depression has been better, anxiety has worsened since here last.    HPI  Anxiety and Depression Currently taking Prozac  20 mg daily She has felt like her depression is well controlled but her anxiety is not She has a lot of stress with her sons -- one was recently hospitalized for suicidal ideation  She is working another job and has added more clients in [place]   ADHD She is currently taking Adderall XR 15 mg in a.m. and when she is working long hours she will take an Adderall 10 mg immediate release at lunch This is working well for her overall She denies any chest pain or palpitations with this regimen  Obesity She is having difficulty with weight loss She has been on portion reduction and overall reduce calories for the past 3 to 6 months Despite her busy schedule she does try to get an exercise including walking most days of the week She is interested in a GLP-1 for weight loss  Elevated blood pressure reading She is not on any medication.  At home blood pressure readings are: Not checked. Patient denies chest pain, SOB, blurred vision, dizziness, unusual headaches, lower leg swelling. Denies excessive caffeine intake, stimulant usage, excessive alcohol intake, or increase in salt consumption.  BP Readings from Last 3 Encounters:  05/14/24 (!) 140/90  02/18/24 (!) 142/99  10/23/23 (!) 142/90       Past Medical History:  Diagnosis Date   Anxiety    Chicken pox    Depression 05/14/2018   Frequent headaches    3-4 days per week; years   GERD (gastroesophageal reflux disease)    diet dependent   Hyperlipidemia    Miscarriage 2005   Vaginal delivery    2006, 2008     Social History   Tobacco Use   Smoking status: Every Day    Current packs/day: 1.00    Average  packs/day: 1 pack/day for 30.0 years (30.0 ttl pk-yrs)    Types: Cigarettes   Smokeless tobacco: Never  Vaping Use   Vaping status: Never Used  Substance Use Topics   Alcohol use: Yes    Alcohol/week: 5.0 standard drinks of alcohol    Types: 2 Glasses of wine, 3 Standard drinks or equivalent per week   Drug use: No    Past Surgical History:  Procedure Laterality Date   APPENDECTOMY     CESAREAN SECTION N/A 05/13/2018   Procedure: CESAREAN SECTION;  Surgeon: Rogene Claude, MD;  Location: [facility];  Service: Obstetrics;  Laterality: N/A;   TONSILLECTOMY      Family History  Problem Relation Age of Onset   Arthritis Mother    Alcohol abuse Mother    Hypertension Mother    Mental illness Mother    Anxiety disorder Mother    Depression Mother    Osteoporosis Mother    Miscarriages / [place] Mother    Alcohol abuse Father    Drug abuse Sister    Early death Sister    Arthritis Maternal Grandmother    Breast cancer Maternal Grandmother 63 - 69   Diabetes Maternal Grandfather    Heart disease Maternal Grandfather    Diabetes Paternal Grandmother    Colon cancer Neg Hx    Rectal cancer Neg  Hx    Stomach cancer Neg Hx     No Known Allergies  Current Medications:   Current Outpatient Medications:    busPIRone (BUSPAR) 15 MG tablet, Take 1 tablet (15 mg total) by mouth 3 (three) times daily as needed., Disp: 90 tablet, Rfl: 1   FLUoxetine  (PROZAC ) 20 MG capsule, TAKE 1 CAPSULE(20 MG) BY MOUTH DAILY, Disp: 90 capsule, Rfl: 0   Multiple Vitamin (MULTIVITAMIN) tablet, Take 2 tablets by mouth daily. [employer] Women's Ultra Mega, Disp: , Rfl:    Semaglutide-Weight Management (WEGOVY) 0.25 MG/0.5ML SOAJ, Inject 0.25 mg into the skin once a week., Disp: 2 mL, Rfl: 1   traZODone  (DESYREL ) 100 MG tablet, TAKE 1 TABLET(100 MG) BY MOUTH AT BEDTIME, Disp: 90 tablet, Rfl: 0   amphetamine -dextroamphetamine  (ADDERALL XR) 15 MG 24 hr capsule, Take 1 capsule by mouth every  morning., Disp: 30 capsule, Rfl: 0   Review of Systems:   Negative unless otherwise specified per HPI.  Vitals:   Vitals:   05/14/24 1007  BP: (!) 140/90  Pulse: 84  Temp: 98.1 F (36.7 C)  TempSrc: Temporal  Weight: 233 lb 6.1 oz (105.9 kg)  Height: 5\' 10"  (1.778 m)     Body mass index is 33.49 kg/m.  Physical Exam:   Physical Exam Vitals and nursing note reviewed.  Constitutional:      General: She is not in acute distress.    Appearance: She is well-developed. She is not ill-appearing or toxic-appearing.  Cardiovascular:     Rate and Rhythm: Normal rate and regular rhythm.     Pulses: Normal pulses.     Heart sounds: Normal heart sounds, S1 normal and S2 normal.  Pulmonary:     Effort: Pulmonary effort is normal.     Breath sounds: Normal breath sounds.  Skin:    General: Skin is warm and dry.  Neurological:     Mental Status: She is alert.     GCS: GCS eye subscore is 4. GCS verbal subscore is 5. GCS motor subscore is 6.  Psychiatric:        Speech: Speech normal.        Behavior: Behavior normal. Behavior is cooperative.     Assessment and Plan:   1. Anxiety and depression (Primary) Uncontrolled Increase Prozac  to 40 mg daily Add BuSpar 15 mg 3 times daily as needed Follow-up in 3 months, sooner if concerns  2. Attention deficit hyperactivity disorder (ADHD), combined type Well-controlled Continue Adderall 15 mg extended release daily in a.m. Continue Adderall 10 mg immediate release at lunch as needed PDMP reviewed without red flags Follow-up in 3 months, sooner if concerns  3. Elevated blood pressure reading Above goal today No evidence of end-organ damage on my exam Recommend patient monitor home blood pressure at least a few times weekly If home monitoring shows consistent elevation, or any symptom(s) develop, recommend reach out to us  for further advice on next steps Follow-up in 3 months, sooner if concerns  4. Obesity, unspecified class,  unspecified obesity type, unspecified whether serious comorbidity present Ongoing Continue diet and exercise Start Wegovy 0.25 mg weekly Risks, benefits, side effects discussed Follow-up in 3 months, sooner if concerns   Alexander Iba, PA-C

## 2024-05-14 NOTE — Patient Instructions (Signed)
 It was great to see you!  We will refill Adderall Increase Prozac  to 40 mg daily  Trial as needed buspar 15 mg for anxiety attacks  Start Wegovy 0.25 mg weekly for weight loss  Let's follow-up in 3 months, sooner if you have concerns.  Take care,  Alexander Iba PA-C

## 2024-05-26 ENCOUNTER — Telehealth: Payer: Self-pay | Admitting: *Deleted

## 2024-05-26 ENCOUNTER — Telehealth: Payer: Self-pay

## 2024-05-26 ENCOUNTER — Other Ambulatory Visit (HOSPITAL_COMMUNITY): Payer: Self-pay

## 2024-05-26 NOTE — Telephone Encounter (Signed)
 Pharmacy Patient Advocate Encounter   Received notification from Onbase that prior authorization for Wegovy  0.25MG /0.5ML auto-injectors is required/requested.   Insurance verification completed.   The patient is insured through [facility] .   Per test claim: PA required; PA submitted to above mentioned insurance via CoverMyMeds Key/confirmation #/EOC [id number] Status is pending

## 2024-05-26 NOTE — Telephone Encounter (Unsigned)
 Copied from CRM [id number]. Topic: Clinical - Prescription Issue >> May 26, 2024  2:52 PM [name] wrote: Reason for CRM: Pt states this medication should have been changed to 40MG  and then sent to the pharmacy, but was not.  FLUoxetine  (PROZAC ) 20 MG capsule [id number] [name], [name] - [address] AT [facility] OF [facility] [name] [name] [address] Phone: [phone] Fax: [phone] Hours: Not open 24 hours   Pt callback is [phone]

## 2024-05-26 NOTE — Telephone Encounter (Signed)
 Pharmacy Patient Advocate Encounter  Received notification from [facility] that Prior Authorization for Wegovy  0.25MG /0.5ML auto-injectors  has been APPROVED from 05/26/24 to 11/10/24. Ran test claim, Copay is $4. This test claim was processed through [facility]- copay amounts may vary at other pharmacies due to pharmacy/plan contracts, or as the patient moves through the different stages of their insurance plan.   PA #/Case ID/Reference #: [id number]

## 2024-05-27 MED ORDER — FLUOXETINE HCL 40 MG PO CAPS: 40.0000 mg | ORAL_CAPSULE | Freq: Every day | ORAL | 2 refills | Status: DC

## 2024-06-24 ENCOUNTER — Other Ambulatory Visit: Payer: Self-pay | Admitting: Physician Assistant

## 2024-07-22 ENCOUNTER — Other Ambulatory Visit: Payer: Self-pay | Admitting: Physician Assistant

## 2024-08-14 ENCOUNTER — Ambulatory Visit: Admitting: Physician Assistant

## 2024-08-14 VITALS — BP 162/124 | HR 90 | Temp 98.2°F | Ht 70.0 in | Wt 225.4 lb

## 2024-08-14 DIAGNOSIS — E669 Obesity, unspecified: Secondary | ICD-10-CM | POA: Diagnosis not present

## 2024-08-14 DIAGNOSIS — F419 Anxiety disorder, unspecified: Secondary | ICD-10-CM

## 2024-08-14 DIAGNOSIS — R030 Elevated blood-pressure reading, without diagnosis of hypertension: Secondary | ICD-10-CM | POA: Diagnosis not present

## 2024-08-14 DIAGNOSIS — F902 Attention-deficit hyperactivity disorder, combined type: Secondary | ICD-10-CM

## 2024-08-14 DIAGNOSIS — F32A Depression, unspecified: Secondary | ICD-10-CM | POA: Diagnosis not present

## 2024-08-14 DIAGNOSIS — Z23 Encounter for immunization: Secondary | ICD-10-CM

## 2024-08-14 MED ORDER — WEGOVY 0.5 MG/0.5ML ~~LOC~~ SOAJ: 0.5000 mg | SUBCUTANEOUS | 1 refills | Status: DC

## 2024-08-14 NOTE — Patient Instructions (Signed)
 It was great to see you!  Stop Adderall  Your blood pressure is elevated in our office today.  I recommend that you monitor this at home.  Your goal blood pressure should be around < 130/80, unless you are over 46 years old, your goal may be closer to 140-150/90. Please note if you have been given other goals from a cardiologist or other healthcare provider, please defer to their recommendations.  When preparing to take your blood pressure: Plan ahead. Don't smoke, drink caffeine or exercise within 30 minutes before taking your blood pressure. Empty your bladder. Don't take the measurement over clothes. Remove the clothing over the arm that will be used to measure blood pressure. You can use either arm unless otherwise told by a healthcare provider. Usually there is not a big difference between readings on them. Be still. Allow at least five minutes of quiet rest before measurements. Don't talk or use the phone. Sit correctly. Sit with your back straight and supported (on a dining chair, rather than a sofa). Your feet should be flat on the floor. Do not cross your legs. Support your arm on a flat surface. The middle of the cuff should be placed on the upper arm at heart level.  Measure at the same time of the day. Take multiple readings and record the results. Each time you measure, take two readings one minute apart. Record the results and bring in to your next office visit.  In order to know how well the medication is working, I would like you to take your readings 1-2 hours after taking your blood pressure medication if possible. Take your blood pressure measurements and record 2-3 days per week. Message me in two weeks with blood pressure readings.  If you get a high blood pressure reading: A single high reading is not an immediate cause for alarm. If you get a reading that is higher than normal, take your blood pressure a second time. Write down the results of both measurements. Check with  your health care professional to see if there's a health concern or whether there may be problems with your monitor. If your blood pressure readings are suddenly higher than 180/120 mm Hg, wait at least one minute and test again. If your readings are still very high, contact your health care professional immediately. You could be having a hypertensive crisis. Call 911 if your blood pressure is higher than 180/120 mm Hg and if you are having new signs or symptoms that may include: Chest pain Shortness of breath Back pain Numbness Weakness Change in vision Difficulty speaking Confusion Dizziness Vomiting  Take care,  Lucie Buttner PA-C

## 2024-08-14 NOTE — Progress Notes (Signed)
 [name] is a 48 y.o. female here for a follow up of a pre-existing problem.  History of Present Illness:   Chief Complaint  Patient presents with   Follow-up    Nothing to discuss. Patient wants Flu vaccine and Hep B vaccine     Discussed the use of AI scribe software for clinical note transcription with the patient, who gave verbal consent to proceed.  History of Present Illness [name] is a 48 year old female who presents with elevated blood pressure and medication management concerns.  Her home blood pressure readings remain elevated despite not taking Adderall today. She has not been on antihypertensive medication previously. Current medications include fluoxetine  40 mg, Buspar  15 mg tid, and trazodone  100 mg. She continues to smoke and describes her work as busy, especially with the upcoming fall and winter months. No chest pain, headaches, or swelling.  She has started Wegovy  0.25 mg wekely and lost at least eight pounds since her last visit. She maintains the initial dose of 0.25 mg and is considering increasing the dose for further weight loss. No significant nausea or constipation with the current dose.  She has received the hepatitis B and flu vaccines.     Past Medical History:  Diagnosis Date   Anxiety    Chicken pox    Depression 05/14/2018   Frequent headaches    3-4 days per week; years   GERD (gastroesophageal reflux disease)    diet dependent   Hyperlipidemia    Miscarriage 2005   Vaginal delivery    2006, 2008     Social History   Tobacco Use   Smoking status: Every Day    Current packs/day: 1.00    Average packs/day: 1 pack/day for 30.0 years (30.0 ttl pk-yrs)    Types: Cigarettes   Smokeless tobacco: Never  Vaping Use   Vaping status: Never Used  Substance Use Topics   Alcohol use: Yes    Alcohol/week: 5.0 standard drinks of alcohol    Types: 2 Glasses of wine, 3 Standard drinks or equivalent per week   Drug use: No    Past Surgical  History:  Procedure Laterality Date   APPENDECTOMY     CESAREAN SECTION N/A 05/13/2018   Procedure: CESAREAN SECTION;  Surgeon: Estelle Service, MD;  Location: [facility];  Service: Obstetrics;  Laterality: N/A;   TONSILLECTOMY      Family History  Problem Relation Age of Onset   Arthritis Mother    Alcohol abuse Mother    Hypertension Mother    Mental illness Mother    Anxiety disorder Mother    Depression Mother    Osteoporosis Mother    Miscarriages / [place] Mother    Alcohol abuse Father    Drug abuse Sister    Early death Sister    Arthritis Maternal Grandmother    Breast cancer Maternal Grandmother 40 - 69   Diabetes Maternal Grandfather    Heart disease Maternal Grandfather    Diabetes Paternal Grandmother    Colon cancer Neg Hx    Rectal cancer Neg Hx    Stomach cancer Neg Hx     No Known Allergies  Current Medications:   Current Outpatient Medications:    amphetamine -dextroamphetamine  (ADDERALL XR) 15 MG 24 hr capsule, Take 1 capsule by mouth every morning., Disp: 30 capsule, Rfl: 0   amphetamine -dextroamphetamine  (ADDERALL XR) 15 MG 24 hr capsule, Take 1 capsule by mouth every morning., Disp: 30 capsule, Rfl: 0  amphetamine -dextroamphetamine  (ADDERALL XR) 15 MG 24 hr capsule, Take 1 capsule by mouth every morning., Disp: 30 capsule, Rfl: 0   amphetamine -dextroamphetamine  (ADDERALL) 10 MG tablet, Take 1 tablet (10 mg total) by mouth daily as needed., Disp: 30 tablet, Rfl: 0   amphetamine -dextroamphetamine  (ADDERALL) 10 MG tablet, Take 1 tablet (10 mg total) by mouth daily as needed., Disp: 30 tablet, Rfl: 0   amphetamine -dextroamphetamine  (ADDERALL) 10 MG tablet, Take 1 tablet (10 mg total) by mouth daily as needed., Disp: 30 tablet, Rfl: 0   busPIRone  (BUSPAR ) 15 MG tablet, TAKE 1 TABLET(15 MG) BY MOUTH THREE TIMES DAILY AS NEEDED, Disp: 270 tablet, Rfl: 0   FLUoxetine  (PROZAC ) 40 MG capsule, TAKE 1 CAPSULE(40 MG) BY MOUTH DAILY, Disp: 90 capsule,  Rfl: 0   Multiple Vitamin (MULTIVITAMIN) tablet, Take 2 tablets by mouth daily. [employer] Women's Ultra Mega, Disp: , Rfl:    semaglutide -weight management (WEGOVY ) 0.5 MG/0.5ML SOAJ SQ injection, Inject 0.5 mg into the skin once a week., Disp: 2 mL, Rfl: 1   traZODone  (DESYREL ) 100 MG tablet, TAKE 1 TABLET(100 MG) BY MOUTH AT BEDTIME, Disp: 90 tablet, Rfl: 0   Review of Systems:   Negative unless otherwise specified per HPI.  Vitals:   Vitals:   08/14/24 0818 08/14/24 0853  BP: (!) 174/122 (!) 162/124  Pulse: 90   Temp: 98.2 F (36.8 C)   TempSrc: Oral   Weight: 225 lb 6.4 oz (102.2 kg)   Height: 5' 10 (1.778 m)      Body mass index is 32.34 kg/m.  Physical Exam:   Physical Exam Vitals and nursing note reviewed.  Constitutional:      General: She is not in acute distress.    Appearance: She is well-developed. She is not ill-appearing or toxic-appearing.  Cardiovascular:     Rate and Rhythm: Normal rate and regular rhythm.     Pulses: Normal pulses.     Heart sounds: Normal heart sounds, S1 normal and S2 normal.  Pulmonary:     Effort: Pulmonary effort is normal.     Breath sounds: Normal breath sounds.  Skin:    General: Skin is warm and dry.  Neurological:     Mental Status: She is alert.     GCS: GCS eye subscore is 4. GCS verbal subscore is 5. GCS motor subscore is 6.  Psychiatric:        Speech: Speech normal.        Behavior: Behavior normal. Behavior is cooperative.     Assessment and Plan:   Assessment and Plan Assessment & Plan Elevated blood pressure readings Persistent elevated blood pressure possibly linked to Adderall use. No hypertension history or antihypertensive medication use. - Stop Adderall for at least two weeks to assess impact on blood pressure. - Monitor blood pressure at home and report average readings via MyChart after two weeks. - Order updated blood work, including thyroid  function tests, to rule out other causes of elevated blood  pressure. She is unable to get this today due to work schedule. She will make follow up appointment.  - If chest pain, shortness of breath, headache(s) -- recommend urgent follow up with us  or evaluate - Encouraged smoking reduction  Attention-deficit hyperactivity disorder, combined type Adderall may contribute to elevated blood pressure. She agreed to stop Adderall to evaluate its effect. - Stop Adderall for at least two weeks. - Consider non-stimulant ADHD medication if blood pressure remains elevated after stopping Adderall.  Obesity Currently on Wegovy  with reported  weight loss. Mild nausea and constipation reported. - Increase Wegovy  dose to 0.5 mg subcutaneous weekly to enhance weight loss and potentially improve blood pressure.  Anxiety and depression Managed with fluoxetine , buspirone , and trazodone .  General Health Maintenance Received Hepatitis B and influenza vaccines.     Lucie Buttner, PA-C

## 2024-08-14 NOTE — Addendum Note (Signed)
 Addended by: BEVELY CURTISTINE PARAS on: 08/14/2024 10:16 AM   Modules accepted: Orders

## 2024-08-14 NOTE — Addendum Note (Signed)
 Addended by: BEVELY CURTISTINE PARAS on: 08/14/2024 09:24 AM   Modules accepted: Orders

## 2024-08-18 ENCOUNTER — Other Ambulatory Visit

## 2024-08-18 ENCOUNTER — Ambulatory Visit: Payer: Self-pay | Admitting: Physician Assistant

## 2024-08-18 DIAGNOSIS — R030 Elevated blood-pressure reading, without diagnosis of hypertension: Secondary | ICD-10-CM | POA: Diagnosis not present

## 2024-08-18 LAB — CBC WITH DIFFERENTIAL/PLATELET
Basophils Absolute: 0 K/uL (ref 0.0–0.1)
Basophils Relative: 0.3 % (ref 0.0–3.0)
Eosinophils Absolute: 0.1 K/uL (ref 0.0–0.7)
Eosinophils Relative: 2 % (ref 0.0–5.0)
HCT: 43.2 % (ref 36.0–46.0)
Hemoglobin: 14.2 g/dL (ref 12.0–15.0)
Lymphocytes Relative: 50.9 % — ABNORMAL HIGH (ref 12.0–46.0)
Lymphs Abs: 2.6 K/uL (ref 0.7–4.0)
MCHC: 33 g/dL (ref 30.0–36.0)
MCV: 97.3 fl (ref 78.0–100.0)
Monocytes Absolute: 0.3 K/uL (ref 0.1–1.0)
Monocytes Relative: 6.1 % (ref 3.0–12.0)
Neutro Abs: 2.1 K/uL (ref 1.4–7.7)
Neutrophils Relative %: 40.7 % — ABNORMAL LOW (ref 43.0–77.0)
Platelets: 218 K/uL (ref 150.0–400.0)
RBC: 4.44 Mil/uL (ref 3.87–5.11)
RDW: 17.3 % — ABNORMAL HIGH (ref 11.5–15.5)
WBC: 5.2 K/uL (ref 4.0–10.5)

## 2024-08-18 LAB — COMPREHENSIVE METABOLIC PANEL WITH GFR
ALT: 26 U/L (ref 0–35)
AST: 40 U/L — ABNORMAL HIGH (ref 0–37)
Albumin: 3.6 g/dL (ref 3.5–5.2)
Alkaline Phosphatase: 95 U/L (ref 39–117)
BUN: 6 mg/dL (ref 6–23)
CO2: 29 meq/L (ref 19–32)
Calcium: 8.7 mg/dL (ref 8.4–10.5)
Chloride: 100 meq/L (ref 96–112)
Creatinine, Ser: 0.71 mg/dL (ref 0.40–1.20)
GFR: 102.23 mL/min (ref >60.00)
Glucose, Bld: 107 mg/dL — ABNORMAL HIGH (ref 70–99)
Potassium: 3.8 meq/L (ref 3.5–5.1)
Sodium: 138 meq/L (ref 135–145)
Total Bilirubin: 0.7 mg/dL (ref 0.2–1.2)
Total Protein: 6.5 g/dL (ref 6.0–8.3)

## 2024-08-18 LAB — LIPID PANEL
Cholesterol: 175 mg/dL (ref 0–200)
HDL: 41.3 mg/dL (ref >39.00)
LDL Cholesterol: 71 mg/dL (ref 0–99)
NonHDL: 133.44
Total CHOL/HDL Ratio: 4
Triglycerides: 313 mg/dL — ABNORMAL HIGH (ref 0.0–149.0)
VLDL: 62.6 mg/dL — ABNORMAL HIGH (ref 0.0–40.0)

## 2024-08-18 LAB — TSH: TSH: 2.43 u[IU]/mL (ref 0.35–5.50)

## 2024-09-15 ENCOUNTER — Encounter: Payer: Self-pay | Admitting: Physician Assistant

## 2024-09-17 ENCOUNTER — Other Ambulatory Visit: Payer: Self-pay | Admitting: Physician Assistant

## 2024-09-17 MED ORDER — AMLODIPINE BESYLATE 5 MG PO TABS: 5.0000 mg | ORAL_TABLET | Freq: Every day | ORAL | 1 refills | Status: DC

## 2024-09-18 ENCOUNTER — Telehealth: Payer: Self-pay

## 2024-09-18 NOTE — Telephone Encounter (Signed)
 Pharmacy Patient Advocate Encounter   Received notification from Onbase that prior authorization for WEGOVY  is required/requested.   Effective October 1st, Medicaid will discontinue coverage of GLP1 medications for weight loss (such as Wegovy  and Zepbound), unless the patient has a documented history of a heart attack or stroke. Zepbound will continue to be covered only for patients with moderate to severe sleep apnea (AHI 15-30) and a BMI greater than 40. Because of this change, the prior authorization team will not be submitting new PA requests for GLP1 medications prescribed for weight loss, as patients will be unable to continue therapy under Medicaid coverage.

## 2024-09-24 ENCOUNTER — Other Ambulatory Visit (HOSPITAL_COMMUNITY): Payer: Self-pay

## 2024-09-25 ENCOUNTER — Other Ambulatory Visit (HOSPITAL_COMMUNITY): Payer: Self-pay

## 2024-10-01 ENCOUNTER — Other Ambulatory Visit: Payer: Self-pay | Admitting: Physician Assistant

## 2024-10-28 ENCOUNTER — Encounter: Admitting: Physician Assistant

## 2024-10-28 ENCOUNTER — Encounter: Payer: Medicaid Other | Admitting: Physician Assistant

## 2024-10-30 ENCOUNTER — Other Ambulatory Visit: Payer: Self-pay | Admitting: Medical Genetics

## 2024-11-03 ENCOUNTER — Other Ambulatory Visit: Payer: Self-pay | Admitting: Physician Assistant

## 2024-11-03 DIAGNOSIS — Z1231 Encounter for screening mammogram for malignant neoplasm of breast: Secondary | ICD-10-CM

## 2024-11-05 ENCOUNTER — Encounter: Payer: Self-pay | Admitting: Physician Assistant

## 2024-11-05 ENCOUNTER — Ambulatory Visit: Admitting: Physician Assistant

## 2024-11-05 VITALS — BP 120/82 | HR 92 | Temp 98.0°F | Ht 70.0 in | Wt 218.2 lb

## 2024-11-05 DIAGNOSIS — F902 Attention-deficit hyperactivity disorder, combined type: Secondary | ICD-10-CM

## 2024-11-05 DIAGNOSIS — R29818 Other symptoms and signs involving the nervous system: Secondary | ICD-10-CM

## 2024-11-05 DIAGNOSIS — I10 Essential (primary) hypertension: Secondary | ICD-10-CM

## 2024-11-05 MED ORDER — AMLODIPINE BESYLATE 10 MG PO TABS: 10.0000 mg | ORAL_TABLET | Freq: Every day | ORAL | 1 refills | Status: AC

## 2024-11-05 MED ORDER — WEGOVY 0.25 MG/0.5ML ~~LOC~~ SOAJ: 0.2500 mg | SUBCUTANEOUS | 0 refills | Status: DC

## 2024-11-05 NOTE — Progress Notes (Signed)
 History of Present Illness:   Chief Complaint  Patient presents with   f/u Blood pressure    Pt here for f/u on blood pressure, she has not been checking blood pressure. Pt denies headaches, dizziness, blurred vision, chest pain, SOB or lower leg edema. Denies excessive caffeine intake, stimulant usage, excessive alcohol intake or increase in salt consumption.     Discussed the use of AI scribe software for clinical note transcription with the patient, who gave verbal consent to proceed.  History of Present Illness   [name] is a 48 year old female with hypertension who presents for blood pressure management and medication review. She is accompanied by her daughter.  She has been checking blood pressure at home and notes readings slightly higher than today's office value of 132/90 mmHg, with a prior peak of 162/124 mmHg. She takes low-dose amlodipine  5 mg daily without leg swelling or other side effects.  She has lost 7 pounds since her last visit, which she attributes to Wegovy , but she has run out of the medication and has been unable to obtain a refill.  She feels tired and sometimes oversleeps, which interferes with her routine. Her daughter admits that her mom snores loud nightly. There is concern for possible sleep apnea. She denies palpitations, chest heaviness, headaches, or vision changes.        Past Medical History:  Diagnosis Date   Anxiety    Chicken pox    Depression 05/14/2018   Frequent headaches    3-4 days per week; years   GERD (gastroesophageal reflux disease)    diet dependent   Hyperlipidemia    Miscarriage 2005   Vaginal delivery    2006, 2008     Social History   Tobacco Use   Smoking status: Every Day    Current packs/day: 1.00    Average packs/day: 1 pack/day for 30.0 years (30.0 ttl pk-yrs)    Types: Cigarettes   Smokeless tobacco: Never  Vaping Use   Vaping status: Never Used  Substance Use Topics   Alcohol use: Yes     Alcohol/week: 5.0 standard drinks of alcohol    Types: 2 Glasses of wine, 3 Standard drinks or equivalent per week   Drug use: No    Past Surgical History:  Procedure Laterality Date   APPENDECTOMY     CESAREAN SECTION N/A 05/13/2018   Procedure: CESAREAN SECTION;  Surgeon: Estelle Service, MD;  Location: [facility];  Service: Obstetrics;  Laterality: N/A;   TONSILLECTOMY      Family History  Problem Relation Age of Onset   Arthritis Mother    Alcohol abuse Mother    Hypertension Mother    Mental illness Mother    Anxiety disorder Mother    Depression Mother    Osteoporosis Mother    Miscarriages / Stillbirths Mother    Alcohol abuse Father    Drug abuse Sister    Early death Sister    Arthritis Maternal Grandmother    Breast cancer Maternal Grandmother 64 - 69   Diabetes Maternal Grandfather    Heart disease Maternal Grandfather    Diabetes Paternal Grandmother    Colon cancer Neg Hx    Rectal cancer Neg Hx    Stomach cancer Neg Hx     No Known Allergies  Current Medications:   Current Outpatient Medications:    amLODipine  (NORVASC ) 10 MG tablet, Take 1 tablet (10 mg total) by mouth daily., Disp: 90 tablet, Rfl: 1  busPIRone  (BUSPAR ) 15 MG tablet, TAKE 1 TABLET(15 MG) BY MOUTH THREE TIMES DAILY AS NEEDED, Disp: 270 tablet, Rfl: 0   FLUoxetine  (PROZAC ) 40 MG capsule, TAKE 1 CAPSULE(40 MG) BY MOUTH DAILY, Disp: 90 capsule, Rfl: 0   Multiple Vitamin (MULTIVITAMIN) tablet, Take 2 tablets by mouth daily. [employer] Women's Ultra Mega, Disp: , Rfl:    semaglutide -weight management (WEGOVY ) 0.25 MG/0.5ML SOAJ SQ injection, Inject 0.25 mg into the skin once a week., Disp: 2 mL, Rfl: 0   traZODone  (DESYREL ) 100 MG tablet, TAKE 1 TABLET(100 MG) BY MOUTH AT BEDTIME, Disp: 90 tablet, Rfl: 0   amphetamine -dextroamphetamine  (ADDERALL XR) 15 MG 24 hr capsule, Take 1 capsule by mouth every morning. (Patient not taking: Reported on 11/05/2024), Disp: 30 capsule, Rfl: 0    amphetamine -dextroamphetamine  (ADDERALL XR) 15 MG 24 hr capsule, Take 1 capsule by mouth every morning. (Patient not taking: Reported on 11/05/2024), Disp: 30 capsule, Rfl: 0   amphetamine -dextroamphetamine  (ADDERALL XR) 15 MG 24 hr capsule, Take 1 capsule by mouth every morning. (Patient not taking: Reported on 11/05/2024), Disp: 30 capsule, Rfl: 0   amphetamine -dextroamphetamine  (ADDERALL) 10 MG tablet, Take 1 tablet (10 mg total) by mouth daily as needed. (Patient not taking: Reported on 11/05/2024), Disp: 30 tablet, Rfl: 0   amphetamine -dextroamphetamine  (ADDERALL) 10 MG tablet, Take 1 tablet (10 mg total) by mouth daily as needed. (Patient not taking: Reported on 11/05/2024), Disp: 30 tablet, Rfl: 0   amphetamine -dextroamphetamine  (ADDERALL) 10 MG tablet, Take 1 tablet (10 mg total) by mouth daily as needed. (Patient not taking: Reported on 11/05/2024), Disp: 30 tablet, Rfl: 0   Review of Systems:   Negative unless otherwise specified per HPI.  Vitals:   Vitals:   11/05/24 0936 11/05/24 0953  BP: (!) 132/90 120/82  Pulse: 92   Temp: 98 F (36.7 C)   TempSrc: Temporal   Weight: 218 lb 4 oz (99 kg)   Height: 5' 10 (1.778 m)      Body mass index is 31.32 kg/m.  Physical Exam:   Physical Exam Vitals and nursing note reviewed.  Constitutional:      General: She is not in acute distress.    Appearance: She is well-developed. She is not ill-appearing or toxic-appearing.  Cardiovascular:     Rate and Rhythm: Normal rate and regular rhythm.     Pulses: Normal pulses.     Heart sounds: Normal heart sounds, S1 normal and S2 normal.  Pulmonary:     Effort: Pulmonary effort is normal.     Breath sounds: Normal breath sounds.  Skin:    General: Skin is warm and dry.  Neurological:     Mental Status: She is alert.     GCS: GCS eye subscore is 4. GCS verbal subscore is 5. GCS motor subscore is 6.  Psychiatric:        Speech: Speech normal.        Behavior: Behavior normal.  Behavior is cooperative.     Assessment and Plan:   Assessment and Plan    Hypertension Blood pressure improved to 132/90 mmHg with current medication. No side effects reported. - Increased amlodipine  to 10 mg daily. - Continue to monitor at home as well. - Will send for obstructive sleep apnea evaluation  Attention Deficit Hyperactivity Disorder (ADHD) Remains uncontrolled due to lack of medication Consider resuming medication once blood pressure is controlled vs considering non-stimulant option  Suspected sleep apnea Symptoms suggestive of sleep apnea. Discussed CPAP and Zepbound benefits if  confirmed. - Referred for home sleep study. - Discussed CPAP therapy and Zepbound if confirmed.        Lucie Buttner, PA-C

## 2024-11-05 NOTE — Patient Instructions (Signed)
 Wt Readings from Last 3 Encounters:  11/05/24 218 lb 4 oz (99 kg)  08/14/24 225 lb 6.4 oz (102.2 kg)  05/14/24 233 lb 6.1 oz (105.9 kg)    BP Readings from Last 3 Encounters:  11/05/24 (!) 132/90  08/14/24 (!) 162/124  05/14/24 (!) 140/90

## 2024-11-10 ENCOUNTER — Ambulatory Visit: Admitting: Physician Assistant

## 2024-11-14 ENCOUNTER — Other Ambulatory Visit (HOSPITAL_COMMUNITY): Payer: Self-pay

## 2024-11-26 ENCOUNTER — Encounter (INDEPENDENT_AMBULATORY_CARE_PROVIDER_SITE_OTHER): Payer: Self-pay

## 2024-11-26 ENCOUNTER — Other Ambulatory Visit

## 2024-11-26 DIAGNOSIS — Z006 Encounter for examination for normal comparison and control in clinical research program: Secondary | ICD-10-CM

## 2024-12-01 ENCOUNTER — Ambulatory Visit

## 2024-12-01 DIAGNOSIS — Z1231 Encounter for screening mammogram for malignant neoplasm of breast: Secondary | ICD-10-CM

## 2024-12-02 ENCOUNTER — Other Ambulatory Visit: Payer: Self-pay | Admitting: Physician Assistant

## 2024-12-02 DIAGNOSIS — Z1231 Encounter for screening mammogram for malignant neoplasm of breast: Secondary | ICD-10-CM

## 2024-12-22 LAB — GENECONNECT MOLECULAR SCREEN: Genetic Analysis Overall Interpretation: POSITIVE — AB

## 2024-12-23 ENCOUNTER — Telehealth: Payer: Self-pay | Admitting: Medical Genetics

## 2024-12-23 DIAGNOSIS — Z1505 Genetic susceptibility to malignant neoplasm of fallopian tube(s): Secondary | ICD-10-CM

## 2024-12-24 ENCOUNTER — Encounter: Payer: Self-pay | Admitting: Pulmonary Disease

## 2024-12-24 ENCOUNTER — Encounter

## 2024-12-24 ENCOUNTER — Ambulatory Visit: Admitting: Pulmonary Disease

## 2024-12-24 VITALS — BP 118/70 | HR 95 | Temp 97.5°F | Ht 70.0 in | Wt 228.2 lb

## 2024-12-24 DIAGNOSIS — F17210 Nicotine dependence, cigarettes, uncomplicated: Secondary | ICD-10-CM | POA: Diagnosis not present

## 2024-12-24 DIAGNOSIS — R0683 Snoring: Secondary | ICD-10-CM

## 2024-12-24 DIAGNOSIS — Z1505 Genetic susceptibility to malignant neoplasm of fallopian tube(s): Secondary | ICD-10-CM | POA: Insufficient documentation

## 2024-12-24 DIAGNOSIS — G4710 Hypersomnia, unspecified: Secondary | ICD-10-CM

## 2024-12-24 DIAGNOSIS — G4719 Other hypersomnia: Secondary | ICD-10-CM

## 2024-12-24 DIAGNOSIS — G478 Other sleep disorders: Secondary | ICD-10-CM

## 2024-12-24 DIAGNOSIS — Z1231 Encounter for screening mammogram for malignant neoplasm of breast: Secondary | ICD-10-CM

## 2024-12-24 NOTE — Telephone Encounter (Signed)
 [facility] Positive Result Note 12/24/2024 3:00 PM  SECOND ATTEMPT: Confirmed I was speaking with [name] [phone] by using name and DOB. Informed participant the reason for this call is to provide results for the above study. Results revealed Hereditary Breast and Ovarian Syndrome. Genetic counseling was offered and participant requests a call back to schedule. All questions were answered, and participant was thanked for their time and support of the above study. Participant was encouraged to contact [facility] if they have any further questions or concerns.

## 2024-12-24 NOTE — Progress Notes (Signed)
 "              [name]    [id number]    05-Mar-1976  Primary Care Physician:Worley, Lucie, [place]  Referring Physician: Job Lucie, PA [address] [place],  [place] [address]  Chief complaint:   Patient being seen for snoring, concern for sleep apnea, severe fatigue Nonrestorative sleep  Discussed the use of AI scribe software for clinical note transcription with the patient, who gave verbal consent to proceed.  History of Present Illness [name] is a 49 year old female who presents with excessive daytime sleepiness and disrupted sleep patterns.  She has been experiencing excessive daytime sleepiness for the past few months, significantly affecting her work and daily activities. She often feels she can sleep all day and sometimes sleeps through her appointments, waking as late as 11 AM. This has impacted her ability to perform daily tasks, such as putting her daughter on the bus in the morning.  She has been on Adderall for about a year but is not currently taking it. She has been on Prozac  for approximately two years, with a recent dose increase, and trazodone  for about two years at a consistent dose. Trazodone  was initially prescribed to help with sleep difficulties.  She has been told that she snores but has not been informed of holding her breath at night or experiencing gasping. She sometimes experiences morning headaches and dry mouth. Her weight has remained stable.  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 She reports that her memory is usually pretty good and she is able to keep focus on things when she is awake and alert. No family history of sleep apnea.  An active smoker, smokes about a pack a day  Symptoms are worsening  Usually goes to bed between 10 and 11 PM Takes about 20 to 30 minutes sleep onset 2-3 awakenings Final wake up time between 6 and 8 AM  She feels a nonrestrictive sleep and fatigue is starting to affect work Weight has been stable She does  have hypertension  Outpatient Encounter Medications as of 12/24/2024  Medication Sig   amLODipine  (NORVASC ) 10 MG tablet Take 1 tablet (10 mg total) by mouth daily.   busPIRone  (BUSPAR ) 15 MG tablet TAKE 1 TABLET(15 MG) BY MOUTH THREE TIMES DAILY AS NEEDED   FLUoxetine  (PROZAC ) 40 MG capsule TAKE 1 CAPSULE(40 MG) BY MOUTH DAILY   Multiple Vitamin (MULTIVITAMIN) tablet Take 2 tablets by mouth daily. [employer] Women's Ultra Mega   traZODone  (DESYREL ) 100 MG tablet TAKE 1 TABLET(100 MG) BY MOUTH AT BEDTIME   [DISCONTINUED] semaglutide -weight management (WEGOVY ) 0.25 MG/0.5ML SOAJ SQ injection Inject 0.25 mg into the skin once a week.   [DISCONTINUED] amphetamine -dextroamphetamine  (ADDERALL XR) 15 MG 24 hr capsule Take 1 capsule by mouth every morning. (Patient not taking: Reported on 11/05/2024)   [DISCONTINUED] amphetamine -dextroamphetamine  (ADDERALL XR) 15 MG 24 hr capsule Take 1 capsule by mouth every morning. (Patient not taking: Reported on 11/05/2024)   [DISCONTINUED] amphetamine -dextroamphetamine  (ADDERALL XR) 15 MG 24 hr capsule Take 1 capsule by mouth every morning. (Patient not taking: Reported on 11/05/2024)   [DISCONTINUED] amphetamine -dextroamphetamine  (ADDERALL) 10 MG tablet Take 1 tablet (10 mg total) by mouth daily as needed. (Patient not taking: Reported on 11/05/2024)   [DISCONTINUED] amphetamine -dextroamphetamine  (ADDERALL) 10 MG tablet Take 1 tablet (10 mg total) by mouth daily as needed. (Patient not taking: Reported on 11/05/2024)   [DISCONTINUED] amphetamine -dextroamphetamine  (ADDERALL) 10 MG tablet Take 1 tablet (10 mg total) by mouth daily  as needed. (Patient not taking: Reported on 11/05/2024)   No facility-administered encounter medications on file as of 12/24/2024.    Allergies as of 12/24/2024   (No Known Allergies)    Past Medical History:  Diagnosis Date   Anxiety    Chicken pox    Depression 05/14/2018   Frequent headaches    3-4 days per week; years   GERD  (gastroesophageal reflux disease)    diet dependent   Hyperlipidemia    Miscarriage 2005   Vaginal delivery    2006, 2008    Past Surgical History:  Procedure Laterality Date   APPENDECTOMY     CESAREAN SECTION N/A 05/13/2018   Procedure: CESAREAN SECTION;  Surgeon: Estelle Service, MD;  Location: [facility];  Service: Obstetrics;  Laterality: N/A;   TONSILLECTOMY      Family History  Problem Relation Age of Onset   Arthritis Mother    Alcohol abuse Mother    Hypertension Mother    Mental illness Mother    Anxiety disorder Mother    Depression Mother    Osteoporosis Mother    Miscarriages / Stillbirths Mother    Alcohol abuse Father    Drug abuse Sister    Early death Sister    Arthritis Maternal Grandmother    Breast cancer Maternal Grandmother 2 - 69   Diabetes Maternal Grandfather    Heart disease Maternal Grandfather    Diabetes Paternal Grandmother    Colon cancer Neg Hx    Rectal cancer Neg Hx    Stomach cancer Neg Hx     Social History   Socioeconomic History   Marital status: Single    Spouse name: Not on file   Number of children: Not on file   Years of education: Not on file   Highest education level: Master's degree (e.g., MA, MS, MEng, MEd, MSW, MBA)  Occupational History   Not on file  Tobacco Use   Smoking status: Every Day    Current packs/day: 1.00    Average packs/day: 1 pack/day for 30.0 years (30.0 ttl pk-yrs)    Types: Cigarettes   Smokeless tobacco: Never  Vaping Use   Vaping status: Never Used  Substance and Sexual Activity   Alcohol use: Yes    Alcohol/week: 5.0 standard drinks of alcohol    Types: 2 Glasses of wine, 3 Standard drinks or equivalent per week   Drug use: No   Sexual activity: Yes    Birth control/protection: None  Other Topics Concern   Not on file  Social History Narrative   Three children -- 53 year old and two teenage boys   LCSW in private practice   Social Drivers of Health   Tobacco Use: High  Risk (12/24/2024)   Patient History    Smoking Tobacco Use: Every Day    Smokeless Tobacco Use: Never    Passive Exposure: Not on file  Financial Resource Strain: Low Risk (10/28/2024)   Overall Financial Resource Strain (CARDIA)    Difficulty of Paying Living Expenses: Not very hard  Food Insecurity: No Food Insecurity (10/28/2024)   Epic    Worried About Radiation Protection Practitioner of Food in the Last Year: Never true    Ran Out of Food in the Last Year: Never true  Transportation Needs: No Transportation Needs (10/28/2024)   Epic    Lack of Transportation (Medical): No    Lack of Transportation (Non-Medical): No  Physical Activity: Inactive (10/28/2024)   Exercise Vital Sign  Days of Exercise per Week: 0 days    Minutes of Exercise per Session: Not on file  Stress: Stress Concern Present (10/28/2024)   [employer] of Occupational Health - Occupational Stress Questionnaire    Feeling of Stress: To some extent  Social Connections: Socially Isolated (10/28/2024)   Social Connection and Isolation Panel    Frequency of Communication with Friends and Family: Once a week    Frequency of Social Gatherings with Friends and Family: Never    Attends Religious Services: Never    Database Administrator or Organizations: No    Attends Engineer, Structural: Not on file    Marital Status: Never married  Intimate Partner Violence: Not on file  Depression (PHQ2-9): Medium Risk (11/05/2024)   Depression (PHQ2-9)    PHQ-2 Score: 10  Alcohol Screen: Low Risk (10/28/2024)   Alcohol Screen    Last Alcohol Screening Score (AUDIT): 7  Housing: Unknown (10/28/2024)   Epic    Unable to Pay for Housing in the Last Year: No    Number of Times Moved in the Last Year: Not on file    Homeless in the Last Year: No  Utilities: Not on file  Health Literacy: Not on file    Review of Systems  Constitutional:  Positive for fatigue.  Psychiatric/Behavioral:  Positive for sleep disturbance.      Vitals:   12/24/24 0934  BP: 118/70  Pulse: 95  Temp: (!) 97.5 F (36.4 C)  SpO2: 96%     Physical Exam Constitutional:      Appearance: She is obese.  HENT:     Head: Normocephalic.     Mouth/Throat:     Mouth: Mucous membranes are moist.  Eyes:     General: No scleral icterus. Cardiovascular:     Rate and Rhythm: Normal rate and regular rhythm.     Heart sounds: No murmur heard.    No friction rub.  Pulmonary:     Effort: No respiratory distress.     Breath sounds: No stridor. No wheezing or rhonchi.  Musculoskeletal:     Cervical back: No rigidity or tenderness.  Skin:    General: Skin is warm.  Neurological:     General: No focal deficit present.     Mental Status: She is alert.  Psychiatric:        Mood and Affect: Mood normal.    Data Reviewed: No previous study on record    Assessment and Plan Assessment & Plan Evaluation for obstructive sleep apnea Reports excessive daytime sleepiness affecting daily activities, including work and appointments. Snoring is present, but no reported apneas or gasping at night. Morning headaches and dry mouth are noted. No family history of sleep apnea. Differential diagnosis includes obstructive sleep apnea, given symptoms and snoring. Current medications include Prozac  and trazodone , which may contribute to sleep disturbances. Smoking may also affect sleep quality. - Ordered home sleep study to evaluate for obstructive sleep apnea. - Discussed potential treatment options if sleep apnea is confirmed, including CPAP, oral devices, and surgery. - Advised on weight loss as a potential non-pharmacological intervention to reduce sleep apnea symptoms.  Tobacco use disorder Continues to smoke, which is not compatible with good sleep quality. Acknowledges the need to quit but has not taken steps to do so. - Advised on the importance of smoking cessation for improving sleep quality. - Encouraged setting a quit date and seeking  support for smoking cessation.  Pathophysiology of sleep disordered breathing discussed  with the patient Treatment options of sleep disordered breathing discussed  Risks with not treating sleep disordered breathing discussed  Tentative follow-up in about 3 months  Encouraged to call with significant concerns  I personally spent a total of 45 minutes in the care of the patient today including preparing to see the patient, getting/reviewing separately obtained history, performing a medically appropriate exam/evaluation, counseling and educating, placing orders, and documenting clinical information in the EHR.  Jennet Epley MD [facility] 12/24/2024, 9:59 AM  CC: Job Lukes, PA   "

## 2024-12-24 NOTE — Patient Instructions (Signed)
 We will schedule you for home sleep test  We will inform you with results as soon as reviewed  Treatment options as we discussed  Continue weight loss efforts  Continue to work on quitting smoking  Tentative follow-up in about 3 months  Call us  with significant concerns

## 2024-12-25 ENCOUNTER — Ambulatory Visit
Admission: RE | Admit: 2024-12-25 | Discharge: 2024-12-25 | Disposition: A | Source: Ambulatory Visit | Attending: Physician Assistant

## 2024-12-25 DIAGNOSIS — Z1231 Encounter for screening mammogram for malignant neoplasm of breast: Secondary | ICD-10-CM

## 2024-12-29 ENCOUNTER — Telehealth: Payer: Self-pay | Admitting: Medical Genetics

## 2024-12-29 NOTE — Telephone Encounter (Signed)
 [facility] 12/29/2024  1:59 PM  Confirmed I was speaking with [name] [phone] by using name and DOB. Genetic counseling was offered and participant is scheduled. All questions were answered, and participant was thanked for their time and support of the above study. Participant was encouraged to contact [facility] if they have any further questions or concerns.    [name], BS [place]  [facility] Clinical Research Specialist II Direct Dial: [phone]  Fax: [phone]

## 2025-01-22 ENCOUNTER — Encounter

## 2025-01-28 ENCOUNTER — Encounter: Admitting: Physician Assistant

## 2025-01-29 ENCOUNTER — Encounter: Admitting: Genetic Counselor

## 2025-04-01 ENCOUNTER — Ambulatory Visit: Admitting: Pulmonary Disease
